# Patient Record
Sex: Female | Born: 1990 | Race: Black or African American | Hispanic: No | Marital: Single | Smoking: Never smoker
Health system: Southern US, Community
[De-identification: ages and names within clinical notes are randomized; demographics above are authoritative.]

## PROBLEM LIST (undated history)

## (undated) DIAGNOSIS — R519 Headache, unspecified: Secondary | ICD-10-CM

## (undated) HISTORY — DX: Headache, unspecified: R51.9

---

## 2011-11-30 ENCOUNTER — Encounter (HOSPITAL_COMMUNITY): Payer: Self-pay

## 2011-11-30 ENCOUNTER — Emergency Department (HOSPITAL_COMMUNITY)
Admission: EM | Admit: 2011-11-30 | Discharge: 2011-11-30 | Disposition: A | Discharge status: Home / Self Care | Payer: Medicaid Other | Source: Home / Self Care

## 2011-11-30 DIAGNOSIS — K089 Disorder of teeth and supporting structures, unspecified: Secondary | ICD-10-CM

## 2011-11-30 DIAGNOSIS — K0889 Other specified disorders of teeth and supporting structures: Secondary | ICD-10-CM

## 2011-11-30 MED ORDER — PENICILLIN V POTASSIUM 500 MG PO TABS: 500.0000 mg | ORAL_TABLET | Freq: Three times a day (TID) | ORAL | Status: AC

## 2011-11-30 MED ORDER — TRAMADOL HCL 50 MG PO TABS: 50.0000 mg | ORAL_TABLET | Freq: Four times a day (QID) | ORAL | Status: AC | PRN

## 2011-11-30 MED ORDER — IBUPROFEN 800 MG PO TABS: 800.0000 mg | ORAL_TABLET | Freq: Three times a day (TID) | ORAL | Status: AC

## 2011-11-30 NOTE — ED Provider Notes (Signed)
History     [name]: [id number]  Arrival date & time 11/30/11  1006   None     Chief Complaint  Patient presents with  . Dental Pain    (Consider location/radiation/quality/duration/timing/severity/associated sxs/prior treatment) HPI Comments: Patient presents today with complaints of dental pain. She states her left lower molar broke last year while she was pregnant, but was not painful.  Approximately 2 weeks ago more of the tooth broke off and has become more painful for her. She has been taking over-the-counter ibuprofen and she gets some improvement of her discomfort for approximately 2 hours . Pain worsens when she bites down on the tooth. No fever or chills. She does have an appointment for routine check up with her dentist in a little over a week. She plans to call them tomorrow (Monday) regarding the tooth pain.   History reviewed. No pertinent past medical history.  Past Surgical History  Procedure Date  . Cesarean section     History reviewed. No pertinent family history.  History  Substance Use Topics  . Smoking status: Never Smoker   . Smokeless tobacco: Not on file  . Alcohol Use: No    OB History    Grav Para Term Preterm Abortions TAB SAB Ect Mult Living                  Review of Systems  Constitutional: Negative for fever and chills.  HENT: Negative for ear pain, congestion, sore throat, rhinorrhea and sinus pressure.   Respiratory: Negative for cough.   Cardiovascular: Negative for chest pain.    Allergies  Review of patient's allergies indicates no known allergies.  Home Medications   Current Outpatient Rx  Name Route Sig Dispense Refill  . IBUPROFEN 800 MG PO TABS Oral Take 1 tablet (800 mg total) by mouth 3 (three) times daily. 15 tablet 0  . PENICILLIN V POTASSIUM 500 MG PO TABS Oral Take 1 tablet (500 mg total) by mouth 3 (three) times daily. 30 tablet 0  . TRAMADOL HCL 50 MG PO TABS Oral Take 1 tablet (50 mg total) by mouth every 6 (six)  hours as needed for pain. 12 tablet 0    BP 136/84  Pulse 80  Temp(Src) 99.1 F (37.3 C) (Oral)  Resp 20  SpO2 100%  LMP 10/25/2011  Physical Exam  Constitutional: She appears well-developed and well-nourished. No distress.  HENT:  Head: Normocephalic and atraumatic.  Right Ear: Tympanic membrane, external ear and ear canal normal.  Left Ear: Tympanic membrane, external ear and ear canal normal.  Nose: Nose normal.  Mouth/Throat: Uvula is midline, oropharynx is clear and moist and mucous membranes are normal. Abnormal dentition. No oropharyngeal exudate, posterior oropharyngeal edema or posterior oropharyngeal erythema.    Neck: Neck supple.  Cardiovascular: Normal rate, regular rhythm and normal heart sounds.   Pulmonary/Chest: Effort normal and breath sounds normal. No respiratory distress.  Lymphadenopathy:    She has no cervical adenopathy.  Neurological: She is alert.  Skin: Skin is warm and dry.  Psychiatric: She has a normal mood and affect.    ED Course  Procedures (including critical care time)  Labs Reviewed - No data to display No results found.   1. Pain, dental       MDM  Broken tooth with possible early abscess formation. Advised to f/u with dentist.         Melody Comas, PA 11/30/11 1116

## 2011-11-30 NOTE — ED Notes (Signed)
Pt has chipped lt sided lower molar that has been hurting since last pm.  Pt has a dentist and tooth has been painful in the past.

## 2011-12-01 NOTE — ED Provider Notes (Signed)
Medical screening examination/treatment/procedure(s) were performed by non-physician practitioner and as supervising physician I was immediately available for consultation/collaboration.   Barkley Bruns MD.    Barkley Bruns, MD 12/01/11 [phone]

## 2012-01-05 ENCOUNTER — Encounter (HOSPITAL_COMMUNITY): Payer: Self-pay

## 2012-01-05 ENCOUNTER — Emergency Department (INDEPENDENT_AMBULATORY_CARE_PROVIDER_SITE_OTHER)
Admission: EM | Admit: 2012-01-05 | Discharge: 2012-01-05 | Disposition: A | Discharge status: Home / Self Care | Payer: Medicaid Other | Source: Home / Self Care | Attending: Emergency Medicine | Admitting: Emergency Medicine

## 2012-01-05 DIAGNOSIS — B373 Candidiasis of vulva and vagina: Secondary | ICD-10-CM

## 2012-01-05 LAB — POCT URINALYSIS DIP (DEVICE)
Glucose, UA: NEGATIVE mg/dL
Hgb urine dipstick: NEGATIVE
Specific Gravity, Urine: 1.015 (ref 1.005–1.030)
Urobilinogen, UA: 0.2 mg/dL (ref 0.0–1.0)

## 2012-01-05 LAB — WET PREP, GENITAL
Clue Cells Wet Prep HPF POC: NONE SEEN
Trich, Wet Prep: NONE SEEN

## 2012-01-05 MED ORDER — FLUCONAZOLE 150 MG PO TABS: 150.0000 mg | ORAL_TABLET | Freq: Once | ORAL | Status: AC

## 2012-01-05 NOTE — ED Provider Notes (Signed)
Chief Complaint  Patient presents with  . Vaginal Itching    History of Present Illness:   Alison Ellison is a 21 year old female who describes a 3 day history of vulvar itching, some brown spotting, but no discharge or odor. She denies any pelvic or lower back pain. No fever, chills, nausea, or vomiting. Her menses have been regular. Last menstrual period was February 25. She is sexually active and uses condoms for birth control.  Review of Systems:  Other than noted above, the patient denies any of the following symptoms: Systemic:  No fever, chills, sweats, fatigue, or weight loss. GI:  No abdominal pain, nausea, anorexia, vomiting, diarrhea, constipation, melena or hematochezia. GU:  No dysuria, frequency, urgency, hematuria, vaginal discharge, itching, or abnormal vaginal bleeding. Skin:  No rash or itching.   PMFSH:  Past medical history, family history, social history, meds, and allergies were reviewed.  Physical Exam:   Vital signs:  BP 112/70  Pulse 94  Temp(Src) 98.4 F (36.9 C) (Oral)  Resp 14  SpO2 100%  LMP 12/15/2011 General:  Alert, oriented and in no distress. Lungs:  Breath sounds clear and equal bilaterally.  No wheezes, rales or rhonchi. Heart:  Regular rhythm.  No gallops or murmers. Abdomen:  Soft, flat and non-distended.  No organomegaly or mass.  No tenderness, guarding or rebound.  Bowel sounds normally active. Pelvic exam:  External genitalia are unremarkable except for a small amount of whitish discharge on the vulva. Vaginal mucosa was normal. She has a scant amount of white discharge which was non-malodorous. Cervix was normal. No cervical motion tenderness, uterus is nontender, no adnexal masses or tenderness. Skin:  Clear, warm and dry.  Labs:   Results for orders placed during the hospital encounter of 01/05/12  POCT URINALYSIS DIP (DEVICE)      Component Value Range   Glucose, UA NEGATIVE  NEGATIVE (mg/dL)   Bilirubin Urine NEGATIVE  NEGATIVE    Ketones,  ur NEGATIVE  NEGATIVE (mg/dL)   Specific Gravity, Urine 1.015  1.005 - 1.030    Hgb urine dipstick NEGATIVE  NEGATIVE    pH 6.0  5.0 - 8.0    Protein, ur 30 (*) NEGATIVE (mg/dL)   Urobilinogen, UA 0.2  0.0 - 1.0 (mg/dL)   Nitrite NEGATIVE  NEGATIVE    Leukocytes, UA SMALL (*) NEGATIVE   POCT PREGNANCY, URINE      Component Value Range   Preg Test, Ur NEGATIVE  NEGATIVE      Assessment:   Diagnoses that have been ruled out:  None  Diagnoses that are still under consideration:  None  Final diagnoses:  Candida vaginitis    Plan:   1.  The following meds were prescribed:   New Prescriptions   FLUCONAZOLE (DIFLUCAN) 150 MG TABLET    Take 1 tablet (150 mg total) by mouth once.   2.  The patient was instructed in symptomatic care and handouts were given. 3.  The patient was told to return if becoming worse in any way, if no better in 3 or 4 days, and given some red flag symptoms that would indicate earlier return.    Reuben Likes, MD 01/05/12 [phone]

## 2012-01-05 NOTE — Discharge Instructions (Signed)
Candida Infection, Adult A candida infection (also called yeast, fungus and Monilia infection) is an overgrowth of yeast that can occur anywhere on the body. A yeast infection commonly occurs in warm, moist body areas. Usually, the infection remains localized but can spread to become a systemic infection. A yeast infection may be a sign of a more severe disease such as diabetes, leukemia, or AIDS. A yeast infection can occur in both men and women. In women, Candida vaginitis is a vaginal infection. It is one of the most common causes of vaginitis. Men usually do not have symptoms or know they have an infection until other problems develop. Men may find out they have a yeast infection because their sex partner has a yeast infection. Uncircumcised men are more likely to get a yeast infection than circumcised men. This is because the uncircumcised glans is not exposed to air and does not remain as dry as that of a circumcised glans. Older adults may develop yeast infections around dentures. CAUSES  Women  Antibiotics.   Steroid medication taken for a long time.   Being overweight (obese).   Diabetes.   Poor immune condition.   Certain serious medical conditions.   Immune suppressive medications for organ transplant patients.   Chemotherapy.   Pregnancy.   Menstration.   Stress and fatigue.   Intravenous drug use.   Oral contraceptives.   Wearing tight-fitting clothes in the crotch area.   Catching it from a sex partner who has a yeast infection.   Spermicide.   Intravenous, urinary, or other catheters.  Men  Catching it from a sex partner who has a yeast infection.   Having oral or anal sex with a person who has the infection.   Spermicide.   Diabetes.   Antibiotics.   Poor immune system.   Medications that suppress the immune system.   Intravenous drug use.   Intravenous, urinary, or other catheters.  SYMPTOMS  Women  Thick, white vaginal discharge.    Vaginal itching.   Redness and swelling in and around the vagina.   Irritation of the lips of the vagina and perineum.   Blisters on the vaginal lips and perineum.   Painful sexual intercourse.   Low blood sugar (hypoglycemia).   Painful urination.   Bladder infections.   Intestinal problems such as constipation, indigestion, bad breath, bloating, increase in gas, diarrhea, or loose stools.  Men  Men may develop intestinal problems such as constipation, indigestion, bad breath, bloating, increase in gas, diarrhea, or loose stools.   Dry, cracked skin on the penis with itching or discomfort.   Jock itch.   Dry, flaky skin.   Athlete's foot.   Hypoglycemia.  DIAGNOSIS  Women  A history and an exam are performed.   The discharge may be examined under a microscope.   A culture may be taken of the discharge.  Men  A history and an exam are performed.   Any discharge from the penis or areas of cracked skin will be looked at under the microscope and cultured.   Stool samples may be cultured.  TREATMENT  Women  Vaginal antifungal suppositories and creams.   Medicated creams to decrease irritation and itching on the outside of the vagina.   Warm compresses to the perineal area to decrease swelling and discomfort.   Oral antifungal medications.   Medicated vaginal suppositories or cream for repeated or recurrent infections.   Wash and dry the irritation areas before applying the cream.     Eating yogurt with lactobacillus may help with prevention and treatment.   Sometimes painting the vagina with gentian violet solution may help if creams and suppositories do not work.  Men  Antifungal creams and oral antifungal medications.   Sometimes treatment must continue for 30 days after the symptoms go away to prevent recurrence.  HOME CARE INSTRUCTIONS  Women  Use cotton underwear and avoid tight-fitting clothing.   Avoid colored, scented toilet paper and  deodorant tampons or pads.   Do not douche.   Keep your diabetes under control.   Finish all the prescribed medications.   Keep your skin clean and dry.   Consume milk or yogurt with lactobacillus active culture regularly. If you get frequent yeast infections and think that is what the infection is, there are over-the-counter medications that you can get. If the infection does not show healing in 3 days, talk to your caregiver.   Tell your sex partner you have a yeast infection. Your partner may need treatment also, especially if your infection does not clear up or recurs.  Men  Keep your skin clean and dry.   Keep your diabetes under control.   Finish all prescribed medications.   Tell your sex partner that you have a yeast infection so they can be treated if necessary.  SEEK MEDICAL CARE IF:   Your symptoms do not clear up or worsen in one week after treatment.   You have an oral temperature above 102 F (38.9 C).   You have trouble swallowing or eating for a prolonged time.   You develop blisters on and around your vagina.   You develop vaginal bleeding and it is not your menstrual period.   You develop abdominal pain.   You develop intestinal problems as mentioned above.   You get weak or lightheaded.   You have painful or increased urination.   You have pain during sexual intercourse.  MAKE SURE YOU:   Understand these instructions.   Will watch your condition.   Will get help right away if you are not doing well or get worse.  Document Released: 11/13/2004 Document Revised: 09/25/2011 Document Reviewed: 02/25/2010 Exit[employer] Patient Information 2012 [employer].  Vaginitis Vaginitis is an infection. It causes soreness, swelling, and redness (inflammation) of the vagina. Many of these infections are sexually transmitted diseases (STDs). Having unprotected sex can cause further problems and complications such as:  Chronic pelvic pain.   Infertility.     Unwanted pregnancy.   Abortion.   Tubal pregnancy.   Infection passed on to the newborn.   Cancer.  CAUSES   Monilia. This is a yeast or fungus infection, not an STD.   Bacterial vaginosis. The normal balance of bacteria in the vagina is disrupted and is replaced by an overgrowth of certain bacteria.   Gonorrhea, chlamydia. These are bacterial infections that are STDs.   Vaginal sponges, diaphragms, and intrauterine devices.   Trichomoniasis. This is a STD infection caused by a parasite.   Viruses like herpes and human papillomavirus. Both are STDs.   Pregnancy.   Immunosuppression. This occurs with certain conditions such as HIV infection or cancer.   Using bubble bath.   Taking certain antibiotic medicines.   Sporadic recurrence can occur if you become sick.   Diabetes.   Steroids.   Allergic reaction. If you have an allergy to:   Douches.   Soaps.   Spermicides.   Condoms.   Scented tampons or vaginal sprays.  SYMPTOMS     Abnormal vaginal discharge.   Itching of the vagina.   Pain in the vagina.   Swelling of the vagina.  In some cases, there are no symptoms. TREATMENT  Treatment will vary depending on the type of infection.  Bacteria or trichomonas are usually treated with oral antibiotics and sometimes vaginal cream or suppositories.   Monilia vaginitis is usually treated with vaginal creams, suppositories, or oral antifungal pills.   Viral vaginitis has no cure. However, the symptoms of herpes (a viral vaginitis) can be treated to relieve the discomfort. Human papillomavirus has no symptoms. However, there are treatments for the diseases caused by human papillomavirus.   With allergic vaginitis, you need to stop using the product that is causing the problem. Vaginal creams can be used to treat the symptoms.   When treating an STD, the sex partner should also be treated.  HOME CARE INSTRUCTIONS   Take all the medicines as directed by  your caregiver.   Do not use scented tampons, soaps, or vaginal sprays.   Do not douche.   Tell your sex partner if you have a vaginal infection or an STD.   Do not have sexual intercourse until you have treated the vaginitis.   Practice safe sex by using condoms.  SEEK MEDICAL CARE IF:   You have abdominal pain.   Your symptoms get worse during treatment.  Document Released: 08/03/2007 Document Revised: 09/25/2011 Document Reviewed: 03/29/2009 Exit[employer] Patient Information 2012 [employer]. 

## 2012-01-05 NOTE — ED Notes (Signed)
C/o vaginal itching and brown discharge for 1 week.  Denies odor or urinary sx.

## 2012-01-06 LAB — GC/CHLAMYDIA PROBE AMP, GENITAL: GC Probe Amp, Genital: NEGATIVE

## 2014-09-29 ENCOUNTER — Encounter (HOSPITAL_COMMUNITY): Payer: Self-pay | Admitting: *Deleted

## 2014-09-29 ENCOUNTER — Emergency Department (INDEPENDENT_AMBULATORY_CARE_PROVIDER_SITE_OTHER)
Admission: EM | Admit: 2014-09-29 | Discharge: 2014-09-29 | Disposition: A | Discharge status: Home / Self Care | Payer: Self-pay | Source: Home / Self Care | Attending: Family Medicine | Admitting: Family Medicine

## 2014-09-29 DIAGNOSIS — N390 Urinary tract infection, site not specified: Secondary | ICD-10-CM

## 2014-09-29 LAB — POCT URINALYSIS DIP (DEVICE)
BILIRUBIN URINE: NEGATIVE
Glucose, UA: NEGATIVE mg/dL
KETONES UR: NEGATIVE mg/dL
Nitrite: NEGATIVE
PROTEIN: 100 mg/dL — AB
SPECIFIC GRAVITY, URINE: 1.015 (ref 1.005–1.030)
Urobilinogen, UA: 1 mg/dL (ref 0.0–1.0)
pH: 7.5 (ref 5.0–8.0)

## 2014-09-29 LAB — POCT PREGNANCY, URINE: PREG TEST UR: NEGATIVE

## 2014-09-29 MED ORDER — CEPHALEXIN 500 MG PO CAPS: 500.0000 mg | ORAL_CAPSULE | Freq: Four times a day (QID) | ORAL | Status: DC

## 2014-09-29 NOTE — Discharge Instructions (Signed)
Take all of medicine as directed, drink lots of fluids, see your doctor if further problems. °

## 2014-09-29 NOTE — ED Notes (Signed)
Pt    Has   Symptoms    Of         Low  abd  Pain  After  Urinating         With        Frequent  Small     Amounts         Pain  r     Side

## 2014-09-29 NOTE — ED Provider Notes (Signed)
[name]: [id number]     Arrival date & time 09/29/14  1256 History   First MD Initiated Contact with Patient 09/29/14 1320     Chief Complaint  Patient presents with  . Urinary Tract Infection   (Consider location/radiation/quality/duration/timing/severity/associated sxs/prior Treatment) Patient is a 23 y.o. female presenting with urinary tract infection. The history is provided by the patient.  Urinary Tract Infection This is a new problem. The current episode started more than 2 days ago. The problem has been gradually worsening. Pertinent negatives include no chest pain and no abdominal pain.    History reviewed. No pertinent past medical history. Past Surgical History  Procedure Laterality Date  . Cesarean section     History reviewed. No pertinent family history. History  Substance Use Topics  . Smoking status: Never Smoker   . Smokeless tobacco: Not on file  . Alcohol Use: No   OB History    No data available     Review of Systems  Constitutional: Negative for fever and chills.  HENT: Negative.   Cardiovascular: Negative for chest pain.  Gastrointestinal: Negative.  Negative for abdominal pain.  Genitourinary: Positive for dysuria, urgency and frequency. Negative for flank pain, vaginal bleeding, vaginal discharge, menstrual problem and pelvic pain.    Allergies  Review of patient's allergies indicates no known allergies.  Home Medications   Prior to Admission medications   Medication Sig Start Date End Date Taking? Authorizing Provider  cephALEXin (KEFLEX) 500 MG capsule Take 1 capsule (500 mg total) by mouth 4 (four) times daily. Take all of medicine and drink lots of fluids 09/29/14   Linna HoffJames D Joanathan Affeldt, MD   BP 123/78 mmHg  Pulse 94  Temp(Src) 98.6 F (37 C) (Oral)  Resp 100  SpO2 100%  LMP 09/16/2014 Physical Exam  Constitutional: She is oriented to person, place, and time. She appears well-developed and well-nourished. No distress.  Abdominal: Soft. Bowel  sounds are normal. She exhibits no mass. There is no tenderness.  Neurological: She is alert and oriented to person, place, and time.  Skin: Skin is warm and dry.  Nursing note and vitals reviewed.   ED Course  Procedures (including critical care time) Labs Review Labs Reviewed  POCT URINALYSIS DIP (DEVICE) - Abnormal; Notable for the following:    Hgb urine dipstick MODERATE (*)    Protein, ur 100 (*)    Leukocytes, UA LARGE (*)    All other components within normal limits  POCT PREGNANCY, URINE    Imaging Review No results found.   MDM   1. UTI (lower urinary tract infection)        Linna HoffJames D Jacari Kirsten, MD 09/29/14 [phone]

## 2015-07-18 ENCOUNTER — Emergency Department (INDEPENDENT_AMBULATORY_CARE_PROVIDER_SITE_OTHER)
Admission: EM | Admit: 2015-07-18 | Discharge: 2015-07-18 | Disposition: A | Discharge status: Home / Self Care | Payer: 59 | Source: Home / Self Care | Attending: Emergency Medicine | Admitting: Emergency Medicine

## 2015-07-18 ENCOUNTER — Other Ambulatory Visit (HOSPITAL_COMMUNITY)
Admission: RE | Admit: 2015-07-18 | Discharge: 2015-07-18 | Disposition: A | Discharge status: Home / Self Care | Payer: 59 | Source: Ambulatory Visit | Attending: Emergency Medicine | Admitting: Emergency Medicine

## 2015-07-18 ENCOUNTER — Encounter (HOSPITAL_COMMUNITY): Payer: Self-pay | Admitting: Emergency Medicine

## 2015-07-18 DIAGNOSIS — N760 Acute vaginitis: Secondary | ICD-10-CM | POA: Diagnosis not present

## 2015-07-18 DIAGNOSIS — K2970 Gastritis, unspecified, without bleeding: Secondary | ICD-10-CM | POA: Diagnosis not present

## 2015-07-18 DIAGNOSIS — A499 Bacterial infection, unspecified: Secondary | ICD-10-CM | POA: Diagnosis not present

## 2015-07-18 DIAGNOSIS — B9689 Other specified bacterial agents as the cause of diseases classified elsewhere: Secondary | ICD-10-CM

## 2015-07-18 DIAGNOSIS — Z113 Encounter for screening for infections with a predominantly sexual mode of transmission: Secondary | ICD-10-CM | POA: Diagnosis not present

## 2015-07-18 LAB — POCT URINALYSIS DIP (DEVICE)
BILIRUBIN URINE: NEGATIVE
GLUCOSE, UA: NEGATIVE mg/dL
KETONES UR: NEGATIVE mg/dL
Nitrite: NEGATIVE
PROTEIN: NEGATIVE mg/dL
Specific Gravity, Urine: 1.015 (ref 1.005–1.030)
Urobilinogen, UA: 0.2 mg/dL (ref 0.0–1.0)
pH: 6 (ref 5.0–8.0)

## 2015-07-18 LAB — POCT PREGNANCY, URINE: PREG TEST UR: NEGATIVE

## 2015-07-18 MED ORDER — OMEPRAZOLE 20 MG PO CPDR: 20.0000 mg | DELAYED_RELEASE_CAPSULE | Freq: Every day | ORAL | Status: DC

## 2015-07-18 MED ORDER — METRONIDAZOLE 500 MG PO TABS: 500.0000 mg | ORAL_TABLET | Freq: Two times a day (BID) | ORAL | Status: DC

## 2015-07-18 NOTE — ED Provider Notes (Signed)
[name]: [id number]     Arrival date & time 07/18/15  1423 History   First MD Initiated Contact with Patient 07/18/15 1518     Chief Complaint  Patient presents with  . Abdominal Pain   (Consider location/radiation/quality/duration/timing/severity/associated sxs/prior Treatment) HPI  She is a 24 year old woman here for evaluation of abdominal pain and vaginal discharge. She states the abdominal pain started about 2 weeks ago. It is epigastrically located. She states it feels sort of crampy. It comes and goes. Eating seems to make it feel better. Having a bowel movement also seems to improve it. She reports some nausea, but only pain gets bad. No vomiting. No fevers or chills. She has been drinking a lot of Pepsi in the last 2 weeks.  She also reports vaginal discharge over the last 2 weeks. She states it isn't clear to yellowish. She also reports irritation and itching in the vagina. She denies any dysuria or urinary frequency. No new sexual partners. She is sexually active with a known partner. They do not use condoms. She has a Mirena that was placed in May of this year.  History reviewed. No pertinent past medical history. Past Surgical History  Procedure Laterality Date  . Cesarean section     No family history on file. Social History  Substance Use Topics  . Smoking status: Never Smoker   . Smokeless tobacco: None  . Alcohol Use: No   OB History    No data available     Review of Systems As in history of present illness Allergies  Review of patient's allergies indicates no known allergies.  Home Medications   Prior to Admission medications   Medication Sig Start Date End Date Taking? Authorizing Provider  metroNIDAZOLE (FLAGYL) 500 MG tablet Take 1 tablet (500 mg total) by mouth 2 (two) times daily. 07/18/15   Charm Rings, MD  omeprazole (PRILOSEC) 20 MG capsule Take 1 capsule (20 mg total) by mouth daily. 07/18/15   Charm Rings, MD   Meds Ordered and Administered this Visit   Medications - No data to display  BP 123/60 mmHg  Pulse 93  Temp(Src) 98.4 F (36.9 C) (Oral)  Resp 16  SpO2 100%  LMP 06/06/2015 No data found.   Physical Exam  Constitutional: She is oriented to person, place, and time. She appears well-developed and well-nourished. No distress.  Neck: Neck supple.  Cardiovascular: Normal rate.   Pulmonary/Chest: Effort normal.  Abdominal: Soft. Bowel sounds are normal. She exhibits no distension. There is tenderness (in epigastric). There is no rebound and no guarding.  No CVA tenderness  Genitourinary: There is no rash on the right labia. There is no rash on the left labia. Cervix exhibits no motion tenderness and no discharge. No bleeding in the vagina. No foreign body around the vagina. No signs of injury around the vagina. Vaginal discharge (yellowish and thin) found.  IUD strings visualized  Neurological: She is alert and oriented to person, place, and time.    ED Course  Procedures (including critical care time)  Labs Review Labs Reviewed  POCT URINALYSIS DIP (DEVICE) - Abnormal; Notable for the following:    Hgb urine dipstick TRACE (*)    Leukocytes, UA SMALL (*)    All other components within normal limits  CERVICOVAGINAL ANCILLARY ONLY    Imaging Review No results found.    MDM   1. BV (bacterial vaginosis)   2. Gastritis    Treat BV with Flagyl. We'll do a trial  of omeprazole for her epigastric pain. Swab sent for testing. Follow-up as needed.    Charm Rings, MD 07/18/15 [phone]

## 2015-07-18 NOTE — Discharge Instructions (Signed)
You have BV. This is an overgrowth of normal vaginal bacteria. Take Flagyl twice a day for 7 days. Do not drink alcohol while taking this medicine. We have sent swabs for testing. We will call you if anything is positive.  I think your stomach pain is coming from some gastritis or heartburn. Take omeprazole daily for the next 2 weeks, then as needed.  Please come back if your symptoms are worsening.

## 2015-07-18 NOTE — ED Notes (Signed)
2 week history of abdominal pain, vaginal discharge and irritation

## 2015-07-19 LAB — CERVICOVAGINAL ANCILLARY ONLY
CHLAMYDIA, DNA PROBE: NEGATIVE
NEISSERIA GONORRHEA: NEGATIVE
TRICH (WINDOWPATH): NEGATIVE

## 2015-07-19 NOTE — ED Notes (Signed)
Final report of STD testing negative. Still waiting on wet prep report

## 2015-07-20 LAB — CERVICOVAGINAL ANCILLARY ONLY: WET PREP (BD AFFIRM): POSITIVE — AB

## 2015-07-20 NOTE — ED Notes (Signed)
Final report vaginal testing swabs. Negative for STD, positive for gardnerella only. treatment adequate

## 2015-07-31 NOTE — ED Notes (Signed)
Patient called, inquiring about lab reports.after verifying ID discussed labs.  Positive for yeast and gardnerella. Called in Rx for Diflucan 150 mg, 1 now and 1 in 1 week if continues to have symptoms to [facility], [address] at patient request. Left detailed message on store answering machine.

## 2015-09-21 ENCOUNTER — Emergency Department (INDEPENDENT_AMBULATORY_CARE_PROVIDER_SITE_OTHER)
Admission: EM | Admit: 2015-09-21 | Discharge: 2015-09-21 | Disposition: A | Discharge status: Home / Self Care | Payer: 59 | Source: Home / Self Care | Attending: Family Medicine | Admitting: Family Medicine

## 2015-09-21 ENCOUNTER — Encounter (HOSPITAL_COMMUNITY): Payer: Self-pay | Admitting: *Deleted

## 2015-09-21 DIAGNOSIS — K529 Noninfective gastroenteritis and colitis, unspecified: Secondary | ICD-10-CM | POA: Diagnosis not present

## 2015-09-21 DIAGNOSIS — R197 Diarrhea, unspecified: Secondary | ICD-10-CM

## 2015-09-21 MED ORDER — DIPHENOXYLATE-ATROPINE 2.5-0.025 MG PO TABS: 1.0000 | ORAL_TABLET | Freq: Four times a day (QID) | ORAL | Status: DC | PRN

## 2015-09-21 NOTE — ED Provider Notes (Addendum)
[name]: [id number]     Arrival date & time 09/21/15  1303 History   First MD Initiated Contact with Patient 09/21/15 1330     Chief Complaint  Patient presents with  . Diarrhea   (Consider location/radiation/quality/duration/timing/severity/associated sxs/prior Treatment) Patient is a 24 y.o. female presenting with diarrhea.  Diarrhea Quality:  Watery Severity:  Mild Onset quality:  Gradual Progression:  Unchanged Relieved by:  None tried Worsened by:  Nothing tried Ineffective treatments:  None tried Associated symptoms: no abdominal pain, no fever and no vomiting   Risk factors: no sick contacts and no suspicious food intake     History reviewed. No pertinent past medical history. Past Surgical History  Procedure Laterality Date  . Cesarean section     History reviewed. No pertinent family history. Social History  Substance Use Topics  . Smoking status: Never Smoker   . Smokeless tobacco: None  . Alcohol Use: No   OB History    No data available     Review of Systems  Constitutional: Negative for fever.  HENT: Negative.   Respiratory: Negative.   Gastrointestinal: Positive for diarrhea. Negative for nausea, vomiting, abdominal pain, constipation, blood in stool and anal bleeding.  Genitourinary: Negative.  Negative for dysuria and urgency.  Neurological: Positive for weakness.  All other systems reviewed and are negative.   Allergies  Review of patient's allergies indicates no known allergies.  Home Medications   Prior to Admission medications   Medication Sig Start Date End Date Taking? Authorizing Provider  diphenoxylate-atropine (LOMOTIL) 2.5-0.025 MG tablet Take 1 tablet by mouth 4 (four) times daily as needed for diarrhea or loose stools. 09/21/15   Linna HoffJames D Kindl, MD  metroNIDAZOLE (FLAGYL) 500 MG tablet Take 1 tablet (500 mg total) by mouth 2 (two) times daily. 07/18/15   Charm RingsErin J Honig, MD  omeprazole (PRILOSEC) 20 MG capsule Take 1 capsule (20 mg total) by  mouth daily. 07/18/15   Charm RingsErin J Honig, MD   Meds Ordered and Administered this Visit  Medications - No data to display  BP 108/72 mmHg  Pulse 104  Temp(Src) 99.6 F (37.6 C) (Oral)  Resp 16  SpO2 100% No data found.   Physical Exam  Constitutional: She is oriented to person, place, and time. She appears well-developed and well-nourished. No distress.  HENT:  Mouth/Throat: Oropharynx is clear and moist.  Neck: Normal range of motion. Neck supple.  Cardiovascular: Normal heart sounds.   Pulmonary/Chest: Breath sounds normal.  Abdominal: Soft. Bowel sounds are normal. She exhibits no distension and no mass. There is no tenderness. There is no rebound and no guarding.  Musculoskeletal: She exhibits no edema.  Lymphadenopathy:    She has no cervical adenopathy.  Neurological: She is alert and oriented to person, place, and time.  Skin: Skin is warm and dry.  Nursing note and vitals reviewed.   ED Course  Procedures (including critical care time)  Labs Review Labs Reviewed - No data to display  Imaging Review No results found.   Visual Acuity Review  Right Eye Distance:   Left Eye Distance:   Bilateral Distance:    Right Eye Near:   Left Eye Near:    Bilateral Near:         MDM   1. Diarrhea in adult patient        Linna HoffJames D Kindl, MD 09/21/15 1429  Linna HoffJames D Kindl, MD 10/10/15 2102

## 2015-09-21 NOTE — ED Notes (Signed)
Body  Aches   Chills      Weak        Nausea         X  sev  Weeks          denys   Any  Urinary  Symptoms

## 2015-09-21 NOTE — Discharge Instructions (Signed)
Clear liquid , bland diet tonight as tolerated, advance on sat as improved, use medicine as needed, return or see your doctor if any problems. °

## 2016-03-03 ENCOUNTER — Encounter (HOSPITAL_COMMUNITY): Payer: Self-pay | Admitting: Emergency Medicine

## 2016-03-03 ENCOUNTER — Emergency Department (HOSPITAL_COMMUNITY)
Admission: EM | Admit: 2016-03-03 | Discharge: 2016-03-03 | Disposition: A | Discharge status: Home / Self Care | Payer: 59 | Attending: Emergency Medicine | Admitting: Emergency Medicine

## 2016-03-03 DIAGNOSIS — R51 Headache: Secondary | ICD-10-CM | POA: Insufficient documentation

## 2016-03-03 DIAGNOSIS — S199XXA Unspecified injury of neck, initial encounter: Secondary | ICD-10-CM | POA: Diagnosis present

## 2016-03-03 DIAGNOSIS — Y939 Activity, unspecified: Secondary | ICD-10-CM | POA: Diagnosis not present

## 2016-03-03 DIAGNOSIS — Y999 Unspecified external cause status: Secondary | ICD-10-CM | POA: Diagnosis not present

## 2016-03-03 DIAGNOSIS — Z79899 Other long term (current) drug therapy: Secondary | ICD-10-CM | POA: Diagnosis not present

## 2016-03-03 DIAGNOSIS — S60811A Abrasion of right wrist, initial encounter: Secondary | ICD-10-CM | POA: Insufficient documentation

## 2016-03-03 DIAGNOSIS — Z792 Long term (current) use of antibiotics: Secondary | ICD-10-CM | POA: Diagnosis not present

## 2016-03-03 DIAGNOSIS — T23171A Burn of first degree of right wrist, initial encounter: Secondary | ICD-10-CM | POA: Diagnosis not present

## 2016-03-03 DIAGNOSIS — Z23 Encounter for immunization: Secondary | ICD-10-CM | POA: Diagnosis not present

## 2016-03-03 DIAGNOSIS — Y92410 Unspecified street and highway as the place of occurrence of the external cause: Secondary | ICD-10-CM | POA: Diagnosis not present

## 2016-03-03 DIAGNOSIS — S134XXA Sprain of ligaments of cervical spine, initial encounter: Secondary | ICD-10-CM | POA: Insufficient documentation

## 2016-03-03 DIAGNOSIS — M542 Cervicalgia: Secondary | ICD-10-CM

## 2016-03-03 DIAGNOSIS — R519 Headache, unspecified: Secondary | ICD-10-CM

## 2016-03-03 MED ORDER — NAPROXEN 500 MG PO TABS
500.0000 mg | ORAL_TABLET | Freq: Once | ORAL | Status: AC
  Administered 2016-03-03: 500 mg via ORAL
  Filled 2016-03-03: qty 1

## 2016-03-03 MED ORDER — HYDROCODONE-ACETAMINOPHEN 5-325 MG PO TABS: 1.0000 | ORAL_TABLET | Freq: Four times a day (QID) | ORAL | Status: DC | PRN

## 2016-03-03 MED ORDER — NAPROXEN 500 MG PO TABS: 500.0000 mg | ORAL_TABLET | Freq: Two times a day (BID) | ORAL | Status: DC | PRN

## 2016-03-03 MED ORDER — HYDROCODONE-ACETAMINOPHEN 5-325 MG PO TABS
1.0000 | ORAL_TABLET | Freq: Once | ORAL | Status: AC
  Administered 2016-03-03: 1 via ORAL
  Filled 2016-03-03: qty 1

## 2016-03-03 MED ORDER — TETANUS-DIPHTH-ACELL PERTUSSIS 5-2.5-18.5 LF-MCG/0.5 IM SUSP
0.5000 mL | Freq: Once | INTRAMUSCULAR | Status: AC
  Administered 2016-03-03: 0.5 mL via INTRAMUSCULAR
  Filled 2016-03-03: qty 0.5

## 2016-03-03 MED ORDER — CYCLOBENZAPRINE HCL 10 MG PO TABS
5.0000 mg | ORAL_TABLET | Freq: Once | ORAL | Status: AC
  Administered 2016-03-03: 5 mg via ORAL
  Filled 2016-03-03: qty 1

## 2016-03-03 MED ORDER — CYCLOBENZAPRINE HCL 10 MG PO TABS: 10.0000 mg | ORAL_TABLET | Freq: Three times a day (TID) | ORAL | Status: DC | PRN
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
## 2017-02-25 ENCOUNTER — Ambulatory Visit (HOSPITAL_COMMUNITY)
Admission: EM | Admit: 2017-02-25 | Discharge: 2017-02-25 | Disposition: A | Discharge status: Home / Self Care | Payer: 59 | Attending: Internal Medicine | Admitting: Internal Medicine

## 2017-02-25 ENCOUNTER — Encounter (HOSPITAL_COMMUNITY): Payer: Self-pay | Admitting: Emergency Medicine

## 2017-02-25 DIAGNOSIS — J029 Acute pharyngitis, unspecified: Secondary | ICD-10-CM | POA: Diagnosis not present

## 2017-02-25 DIAGNOSIS — M791 Myalgia: Secondary | ICD-10-CM

## 2017-02-25 LAB — POCT RAPID STREP A: Streptococcus, Group A Screen (Direct): NEGATIVE

## 2017-02-25 NOTE — ED Triage Notes (Signed)
Symptoms started last night.  Stuffy in head, headache, sore throat.  One episode of diarrhea today

## 2017-02-25 NOTE — Discharge Instructions (Addendum)
Strep swab was negative today at the urgent care.  A throat culture is pending; the urgent care will contact you if additional treatment is needed.  Rest and push fluids.  Note for work given.  Ibuprofen or tylenol for pain.  Recheck for new fever >100.5, increasing phlegm production/nasal discharge, or if not starting to improve in a few days.

## 2017-02-25 NOTE — ED Provider Notes (Signed)
[facility]    [name]: [id number] Arrival date & time: 02/25/17  1541     History   Chief Complaint Chief Complaint  Patient presents with  . URI    HPI Alison Ellison is a 26 y.o. female. She had the onset of sore throat and body aches yesterday evening. No fever, no runny/congested nose. Not coughing. No nausea/vomiting. Little bit loose stool today. Her right ear hurt a few days ago, but has resolved now.    HPI  History reviewed. No pertinent past medical history.   Past Surgical History:  Procedure Laterality Date  . CESAREAN SECTION       Home Medications   Takes no meds regularly  Family History No family history on file.  Social History Social History  Substance Use Topics  . Smoking status: Never Smoker  . Smokeless tobacco: Not on file  . Alcohol use No     Allergies   Aspirin   Review of Systems Review of Systems  All other systems reviewed and are negative.    Physical Exam Triage Vital Signs ED Triage Vitals  Enc Vitals Group     BP 02/25/17 1632 119/73     Pulse Rate 02/25/17 1632 94     Resp 02/25/17 1632 18     Temp 02/25/17 1632 98.3 F (36.8 C)     Temp Source 02/25/17 1632 Oral     SpO2 02/25/17 1632 99 %     Weight --      Height --      Pain Score 02/25/17 1629 7     Pain Loc --    Updated Vital Signs BP 119/73 (BP Location: Right Arm)   Pulse 94   Temp 98.3 F (36.8 C) (Oral)   Resp 18   SpO2 99%   Physical Exam  Constitutional: She is oriented to person, place, and time. No distress.  HENT:  Head: Atraumatic.  Bilateral TMs are moderately dull Moderate nasal congestion bilaterally Throat is injected with deep pink, large tonsils, no exudates  Eyes:  Conjugate gaze observed, no eye redness/discharge  Neck: Neck supple.  Cardiovascular: Normal rate and regular rhythm.   Pulmonary/Chest: No respiratory distress. She has no wheezes. She has no rales.  Abdominal: She exhibits no distension.    Musculoskeletal: Normal range of motion.  Neurological: She is alert and oriented to person, place, and time.  Skin: Skin is warm and dry.  Nursing note and vitals reviewed.    UC Treatments / Results  Labs Results for orders placed or performed during the hospital encounter of 02/25/17  Culture, group A strep  Result Value Ref Range   Specimen Description THROAT    Special Requests NONE    Culture NO GROUP A STREP (S.PYOGENES) ISOLATED    Report Status 02/28/2017 FINAL   POCT rapid strep A [facility]  Result Value Ref Range   Streptococcus, Group A Screen (Direct) NEGATIVE NEGATIVE     Procedures Procedures (including critical care time) None today  Final Clinical Impressions(s) / UC Diagnoses   Final diagnoses:  Acute pharyngitis, unspecified etiology   Strep swab was negative today at the urgent care.  A throat culture is pending; the urgent care will contact you if additional treatment is needed.  Rest and push fluids.  Note for work given.  Ibuprofen or tylenol for pain.  Recheck for new fever >100.5, increasing phlegm production/nasal discharge, or if not starting to improve in a few days.  Eustace Moore, MD 03/01/17 [phone]

## 2017-02-28 LAB — CULTURE, GROUP A STREP (THRC)

## 2017-03-05 ENCOUNTER — Encounter (HOSPITAL_COMMUNITY): Payer: Self-pay | Admitting: Emergency Medicine

## 2017-03-05 ENCOUNTER — Ambulatory Visit (HOSPITAL_COMMUNITY)
Admission: EM | Admit: 2017-03-05 | Discharge: 2017-03-05 | Disposition: A | Discharge status: Home / Self Care | Payer: 59 | Attending: Internal Medicine | Admitting: Internal Medicine

## 2017-03-05 DIAGNOSIS — H6981 Other specified disorders of Eustachian tube, right ear: Secondary | ICD-10-CM

## 2017-03-05 DIAGNOSIS — H9203 Otalgia, bilateral: Secondary | ICD-10-CM | POA: Diagnosis not present

## 2017-03-05 DIAGNOSIS — H66002 Acute suppurative otitis media without spontaneous rupture of ear drum, left ear: Secondary | ICD-10-CM

## 2017-03-05 MED ORDER — AMOXICILLIN 500 MG PO CAPS: 1000.0000 mg | ORAL_CAPSULE | Freq: Two times a day (BID) | ORAL | 0 refills | Status: DC

## 2017-03-05 NOTE — ED Triage Notes (Signed)
Onset 2 days ago with ear pain.  Seen last week for sore throat.  Patient has been coughing up mucus for a couple of day

## 2017-03-05 NOTE — ED Provider Notes (Signed)
[name]: [id number]     Arrival date & time 03/05/17  1704 History   First MD Initiated Contact with Patient 03/05/17 1821     Chief Complaint  Patient presents with  . Otalgia   (Consider location/radiation/quality/duration/timing/severity/associated sxs/prior Treatment) Bilateral earaches for the past 2 days. It is greater than right. Continues to have PND and nasal congestion. She has taken ibuprofen and has put some sort of OTC ear drops in her left ear for pain.      History reviewed. No pertinent past medical history. Past Surgical History:  Procedure Laterality Date  . CESAREAN SECTION     History reviewed. No pertinent family history. Social History  Substance Use Topics  . Smoking status: Never Smoker  . Smokeless tobacco: Not on file  . Alcohol use No   OB History    No data available     Review of Systems  Constitutional: Negative.  Negative for activity change, appetite change, chills, fatigue and fever.  HENT: Positive for congestion, ear pain, postnasal drip and rhinorrhea. Negative for facial swelling.   Eyes: Negative.   Respiratory: Negative.   Cardiovascular: Negative.   Musculoskeletal: Negative for neck pain and neck stiffness.  Skin: Negative for pallor and rash.  Neurological: Negative.   All other systems reviewed and are negative.   Allergies  Aspirin  Home Medications   Prior to Admission medications   Medication Sig Start Date End Date Taking? Authorizing Provider  GuaiFENesin (MUCINEX PO) Take by mouth.   Yes [provider]  ibuprofen (ADVIL,MOTRIN) 800 MG tablet Take 800 mg by mouth every 8 (eight) hours as needed.   Yes [provider]  amoxicillin (AMOXIL) 500 MG capsule Take 2 capsules (1,000 mg total) by mouth 2 (two) times daily. 03/05/17   Hayden Rasmussen, NP   Meds Ordered and Administered this Visit  Medications - No data to display  BP 113/74 (BP Location: Right Arm)   Pulse 86   Temp 99.3 F (37.4 C) (Oral)    Resp 14   SpO2 100%  No data found.   Physical Exam  Constitutional: She is oriented to person, place, and time. She appears well-developed and well-nourished.  HENT:  Head: Normocephalic and atraumatic.  Right TM early gray translucent. Retracted. Left TM with partial retraction, erythema to the anterior and superior aspect. Appears to have a yellowish to amber fluid to the 6 and 7:00 position. Oropharynx with moderate amount of clear PND, cobblestoning and light erythema.  Eyes: EOM are normal.  Neck: Normal range of motion. Neck supple.  Cardiovascular: Normal rate.   Pulmonary/Chest: Effort normal and breath sounds normal.  Lymphadenopathy:    She has no cervical adenopathy.  Neurological: She is alert and oriented to person, place, and time.  Skin: Skin is warm and dry.  Psychiatric: She has a normal mood and affect.  Nursing note and vitals reviewed.   Urgent Care Course     Procedures (including critical care time)  Labs Review Labs Reviewed - No data to display  Imaging Review No results found.   Visual Acuity Review  Right Eye Distance:   Left Eye Distance:   Bilateral Distance:    Right Eye Near:   Left Eye Near:    Bilateral Near:         MDM   1. Otalgia of both ears   2. Acute suppurative otitis media of left ear without spontaneous rupture of tympanic membrane, recurrence not specified   3. Eustachian  tube dysfunction, right    Take the medication as directed. In addition take Sudafed PE 10 mg every 4 hours as a decongestant and take either Zyrtec or Allegra daily to help with drainage in the back of your throat. Ibuprofen 600 mg every 6 hours as needed for pain. Meds ordered this encounter  Medications  . ibuprofen (ADVIL,MOTRIN) 800 MG tablet    Sig: Take 800 mg by mouth every 8 (eight) hours as needed.  . GuaiFENesin (MUCINEX PO)    Sig: Take by mouth.  Marland Kitchen. amoxicillin (AMOXIL) 500 MG capsule    Sig: Take 2 capsules (1,000 mg total) by  mouth 2 (two) times daily.    Dispense:  40 capsule    Refill:  0    Order Specific Question:   Supervising Provider    Answer:   Eustace MooreMURRAY, LAURA W [[id number]]       Hayden RasmussenMabe, Keyra Virella, NP 03/05/17 [phone]

## 2017-03-05 NOTE — Discharge Instructions (Signed)
Take the medication as directed. In addition take Sudafed PE 10 mg every 4 hours as a decongestant and take either Zyrtec or Allegra daily to help with drainage in the back of your throat. Ibuprofen 600 mg every 6 hours as needed for pain.

## 2017-05-02 ENCOUNTER — Ambulatory Visit (HOSPITAL_COMMUNITY)
Admission: EM | Admit: 2017-05-02 | Discharge: 2017-05-02 | Disposition: A | Discharge status: Home / Self Care | Payer: 59 | Attending: Internal Medicine | Admitting: Internal Medicine

## 2017-05-02 ENCOUNTER — Encounter (HOSPITAL_COMMUNITY): Payer: Self-pay | Admitting: *Deleted

## 2017-05-02 DIAGNOSIS — N3001 Acute cystitis with hematuria: Secondary | ICD-10-CM

## 2017-05-02 DIAGNOSIS — Z3202 Encounter for pregnancy test, result negative: Secondary | ICD-10-CM | POA: Insufficient documentation

## 2017-05-02 DIAGNOSIS — Z113 Encounter for screening for infections with a predominantly sexual mode of transmission: Secondary | ICD-10-CM

## 2017-05-02 DIAGNOSIS — R300 Dysuria: Secondary | ICD-10-CM | POA: Diagnosis not present

## 2017-05-02 LAB — POCT URINALYSIS DIP (DEVICE)
BILIRUBIN URINE: NEGATIVE
Glucose, UA: NEGATIVE mg/dL
Ketones, ur: NEGATIVE mg/dL
NITRITE: NEGATIVE
PH: 7 (ref 5.0–8.0)
Protein, ur: 30 mg/dL — AB
Specific Gravity, Urine: 1.015 (ref 1.005–1.030)
UROBILINOGEN UA: 1 mg/dL (ref 0.0–1.0)

## 2017-05-02 LAB — POCT PREGNANCY, URINE: PREG TEST UR: NEGATIVE

## 2017-05-02 MED ORDER — NITROFURANTOIN MONOHYD MACRO 100 MG PO CAPS: 100.0000 mg | ORAL_CAPSULE | Freq: Two times a day (BID) | ORAL | 0 refills | Status: AC

## 2017-05-02 NOTE — ED Triage Notes (Signed)
C/O dysuria without polyuria since yesterday.  Reports blood on toilet tissue when she wipes.  Denies flank or back pain.  Denies fevers.

## 2017-05-02 NOTE — ED Triage Notes (Signed)
Pt also requesting getting tested for STDs.  Denies any vaginal discharge.

## 2017-05-02 NOTE — ED Provider Notes (Signed)
[name]: [id number]     Arrival date & time 05/02/17  1237 History   First MD Initiated Contact with Patient 05/02/17 1440     Chief Complaint  Patient presents with  . Dysuria   (Consider location/radiation/quality/duration/timing/severity/associated sxs/prior Treatment) Patient is a healthy 26 year old female, presents today for possible UTI. Patient reports dysuria and hematuria 2 days. She otherwise denies nausea, fever, abdominal pain, back pain.  Patient also wants STD testing. She is asymptomatic with no vaginal discharge or pelvic pain. Patient states that she just wanted to be check to be safe. She is sexually active with 1 partner.        History reviewed. No pertinent past medical history. Past Surgical History:  Procedure Laterality Date  . CESAREAN SECTION     No family history on file. Social History  Substance Use Topics  . Smoking status: Never Smoker  . Smokeless tobacco: Not on file  . Alcohol use No   OB History    No data available     Review of Systems  Constitutional:       See history of present illness    Allergies  Aspirin  Home Medications   Prior to Admission medications   Medication Sig Start Date End Date Taking? Authorizing Provider  amoxicillin (AMOXIL) 500 MG capsule Take 2 capsules (1,000 mg total) by mouth 2 (two) times daily. 03/05/17   Hayden RasmussenMabe, David, NP  GuaiFENesin (MUCINEX PO) Take by mouth.    [provider]  ibuprofen (ADVIL,MOTRIN) 800 MG tablet Take 800 mg by mouth every 8 (eight) hours as needed.    [provider]  nitrofurantoin, macrocrystal-monohydrate, (MACROBID) 100 MG capsule Take 1 capsule (100 mg total) by mouth 2 (two) times daily. 05/02/17 05/07/17  Lucia EstelleZheng, Baldemar Dady, NP   Meds Ordered and Administered this Visit  Medications - No data to display  BP 124/83   Pulse 73   Temp 98.2 F (36.8 C) (Temporal)   Resp 16   SpO2 100%  No data found.   Physical Exam  Constitutional: She is oriented to  person, place, and time. She appears well-developed and well-nourished.  Cardiovascular: Normal rate, regular rhythm and normal heart sounds.   No murmur heard. Pulmonary/Chest: Effort normal and breath sounds normal. She has no wheezes.  Abdominal: Soft. Bowel sounds are normal.  Some suprapubic discomfort noted on palpation  Genitourinary:  Genitourinary Comments: Negative CVA tenderness  Neurological: She is alert and oriented to person, place, and time.  Skin: Skin is warm and dry.  Psychiatric: She has a normal mood and affect.  Nursing note and vitals reviewed.   Urgent Care Course     Procedures (including critical care time)  Labs Review Labs Reviewed  POCT URINALYSIS DIP (DEVICE) - Abnormal; Notable for the following:       Result Value   Hgb urine dipstick MODERATE (*)    Protein, ur 30 (*)    Leukocytes, UA MODERATE (*)    All other components within normal limits  URINE CULTURE  POCT PREGNANCY, URINE  URINE CYTOLOGY ANCILLARY ONLY    Imaging Review No results found.  MDM   1. Acute cystitis with hematuria   2. Screening for STD (sexually transmitted disease)    UA has moderate amt of hgb and moderate amt of leukocytes. We'll treat empirically for UTI with Macrobid twice a day 5 days. Urine culture is pending.   Urine cytology pending for GC/CT and Trich, informed that we will only call her  if result is positive.      Lucia Estelle, NP 05/02/17 [phone]

## 2017-05-02 NOTE — ED Notes (Signed)
Call back number verified and updated in EPIC... Adv pt to not have SI until lab results comeback neg.... Also adv pt lab results will be on MyChart; instructions given .... Pt verb understanding.   

## 2017-05-04 LAB — URINE CULTURE

## 2017-05-04 LAB — URINE CYTOLOGY ANCILLARY ONLY
Chlamydia: NEGATIVE
NEISSERIA GONORRHEA: NEGATIVE
TRICH (WINDOWPATH): NEGATIVE

## 2017-12-17 DIAGNOSIS — Z975 Presence of (intrauterine) contraceptive device: Secondary | ICD-10-CM | POA: Insufficient documentation

## 2017-12-17 DIAGNOSIS — R87610 Atypical squamous cells of undetermined significance on cytologic smear of cervix (ASC-US): Secondary | ICD-10-CM | POA: Insufficient documentation

## 2017-12-17 DIAGNOSIS — R87810 Cervical high risk human papillomavirus (HPV) DNA test positive: Secondary | ICD-10-CM | POA: Insufficient documentation

## 2018-01-17 DIAGNOSIS — N870 Mild cervical dysplasia: Secondary | ICD-10-CM | POA: Insufficient documentation

## 2018-05-29 ENCOUNTER — Emergency Department (HOSPITAL_COMMUNITY)
Admission: EM | Admit: 2018-05-29 | Discharge: 2018-05-29 | Disposition: A | Discharge status: Home / Self Care | Payer: 59 | Attending: Emergency Medicine | Admitting: Emergency Medicine

## 2018-05-29 ENCOUNTER — Encounter (HOSPITAL_COMMUNITY): Payer: Self-pay | Admitting: Emergency Medicine

## 2018-05-29 DIAGNOSIS — R110 Nausea: Secondary | ICD-10-CM

## 2018-05-29 DIAGNOSIS — R51 Headache: Secondary | ICD-10-CM | POA: Diagnosis not present

## 2018-05-29 DIAGNOSIS — R519 Headache, unspecified: Secondary | ICD-10-CM

## 2018-05-29 DIAGNOSIS — Z79899 Other long term (current) drug therapy: Secondary | ICD-10-CM | POA: Diagnosis not present

## 2018-05-29 LAB — CBC WITH DIFFERENTIAL/PLATELET
BASOS PCT: 0 %
Basophils Absolute: 0 10*3/uL (ref 0.0–0.1)
EOS ABS: 0.1 10*3/uL (ref 0.0–0.7)
Eosinophils Relative: 1 %
HEMATOCRIT: 36.1 % (ref 36.0–46.0)
HEMOGLOBIN: 12.7 g/dL (ref 12.0–15.0)
LYMPHS ABS: 2.9 10*3/uL (ref 0.7–4.0)
Lymphocytes Relative: 42 %
MCH: 29.3 pg (ref 26.0–34.0)
MCHC: 35.2 g/dL (ref 30.0–36.0)
MCV: 83.4 fL (ref 78.0–100.0)
MONOS PCT: 5 %
Monocytes Absolute: 0.4 10*3/uL (ref 0.1–1.0)
NEUTROS ABS: 3.5 10*3/uL (ref 1.7–7.7)
NEUTROS PCT: 52 %
Platelets: 246 10*3/uL (ref 150–400)
RBC: 4.33 MIL/uL (ref 3.87–5.11)
RDW: 13 % (ref 11.5–15.5)
WBC: 6.9 10*3/uL (ref 4.0–10.5)

## 2018-05-29 LAB — I-STAT BETA HCG BLOOD, ED (MC, WL, AP ONLY): I-stat hCG, quantitative: <5 m[IU]/mL (ref <5)

## 2018-05-29 LAB — COMPREHENSIVE METABOLIC PANEL
ALBUMIN: 4.1 g/dL (ref 3.5–5.0)
ALK PHOS: 49 U/L (ref 38–126)
ALT: 14 U/L (ref 0–44)
AST: 26 U/L (ref 15–41)
Anion gap: 8 (ref 5–15)
BUN: 13 mg/dL (ref 6–20)
CALCIUM: 8.9 mg/dL (ref 8.9–10.3)
CO2: 27 mmol/L (ref 22–32)
CREATININE: 1.07 mg/dL — AB (ref 0.44–1.00)
Chloride: 106 mmol/L (ref 98–111)
GFR calc non Af Amer: >60 mL/min (ref >60)
GLUCOSE: 108 mg/dL — AB (ref 70–99)
Potassium: 4.5 mmol/L (ref 3.5–5.1)
SODIUM: 141 mmol/L (ref 135–145)
Total Bilirubin: 0.6 mg/dL (ref 0.3–1.2)
Total Protein: 7.3 g/dL (ref 6.5–8.1)

## 2018-05-29 MED ORDER — ONDANSETRON 4 MG PO TBDP: 4.0000 mg | ORAL_TABLET | Freq: Three times a day (TID) | ORAL | 0 refills | Status: DC | PRN

## 2018-05-29 MED ORDER — SODIUM CHLORIDE 0.9 % IV BOLUS
1000.0000 mL | Freq: Once | INTRAVENOUS | Status: AC
  Administered 2018-05-29: 1000 mL via INTRAVENOUS

## 2018-05-29 MED ORDER — METOCLOPRAMIDE HCL 5 MG/ML IJ SOLN
10.0000 mg | Freq: Once | INTRAMUSCULAR | Status: AC
  Administered 2018-05-29: 10 mg via INTRAVENOUS
  Filled 2018-05-29: qty 2

## 2018-05-29 NOTE — Discharge Instructions (Addendum)
If you develop fever, vomiting, abdominal pain, or any other new/concerning symptoms and return to the ER for evaluation.

## 2018-05-29 NOTE — ED Triage Notes (Signed)
Patient c/o headache, nausea, and lightheadedness since eating chinese today.

## 2018-05-29 NOTE — ED Provider Notes (Signed)
[facility]EM[facility]ENC[facility]PT Provider Note   [name]: [id number] Arrival date & time: 05/29/18  1752     History   Chief Complaint Chief Complaint  Patient presents with  . Headache  . Nausea    HPI Alison Ellison is a 27 y.o. female.  HPI  27 year old female presents with headache, dizziness, and nausea after eating [place] food.  She states that 5 minutes after eating takeout she developed the symptoms.  Occurred about 2 hours prior to arrival.  The dizziness and blurry vision that comes with it seems to come and go.  Feels like a room spinning sensation.  She also has a moderate frontal headache, 6 out of 10 in intensity.  This is less severe than the typical headaches she gets normally.  She also has some nausea without vomiting and feels abdominal bloating.  No abdominal pain or diarrhea.  She had a bowel movement prior to arrival that was normal.  No fevers.  History reviewed. No pertinent past medical history.  There are no active problems to display for this patient.   Past Surgical History:  Procedure Laterality Date  . CESAREAN SECTION       OB History   None      Home Medications    Prior to Admission medications   Medication Sig Start Date End Date Taking? Authorizing Provider  ibuprofen (ADVIL,MOTRIN) 800 MG tablet Take 800 mg by mouth every 8 (eight) hours as needed for mild pain.    Yes [provider]  Multiple Vitamin (MULTIVITAMIN WITH MINERALS) TABS tablet Take 1 tablet by mouth daily.   Yes [provider]  amoxicillin (AMOXIL) 500 MG capsule Take 2 capsules (1,000 mg total) by mouth 2 (two) times daily. Patient not taking: Reported on 05/29/2018 03/05/17   Hayden Rasmussen, NP  ondansetron (ZOFRAN ODT) 4 MG disintegrating tablet Take 1 tablet (4 mg total) by mouth every 8 (eight) hours as needed for nausea or vomiting. 05/29/18   Pricilla Loveless, MD    Family History No family history on file.  Social History Social  History   Tobacco Use  . Smoking status: Never Smoker  Substance Use Topics  . Alcohol use: No  . Drug use: No     Allergies   Aspirin   Review of Systems Review of Systems  Constitutional: Negative for fever.  Eyes: Positive for visual disturbance.  Respiratory: Negative for shortness of breath.   Cardiovascular: Negative for chest pain.  Gastrointestinal: Positive for nausea. Negative for abdominal pain, diarrhea and vomiting.  Genitourinary: Negative for dysuria.  Musculoskeletal: Negative for back pain.  Neurological: Positive for dizziness and headaches.  All other systems reviewed and are negative.    Physical Exam Updated Vital Signs BP 129/83 (BP Location: Left Arm)   Pulse 78   Temp 98.4 F (36.9 C) (Oral)   Resp 16   SpO2 99%   Physical Exam  Constitutional: She is oriented to person, place, and time. She appears well-developed and well-nourished.  Non-toxic appearance. She does not appear ill. No distress.  HENT:  Head: Normocephalic and atraumatic.  Right Ear: External ear normal.  Left Ear: External ear normal.  Nose: Nose normal.  Eyes: Pupils are equal, round, and reactive to light. EOM are normal. Right eye exhibits no discharge. Left eye exhibits no discharge.  Neck: Normal range of motion. Neck supple.  Cardiovascular: Normal rate, regular rhythm and normal heart sounds.  Pulmonary/Chest: Effort normal and breath sounds normal.  Abdominal:  Soft. She exhibits no distension. There is no tenderness.  Neurological: She is alert and oriented to person, place, and time.  CN 3-12 grossly intact. 5/5 strength in all 4 extremities. Grossly normal sensation. Normal finger to nose.   Skin: Skin is warm and dry.  Nursing note and vitals reviewed.    ED Treatments / Results  Labs (all labs ordered are listed, but only abnormal results are displayed) Labs Reviewed  COMPREHENSIVE METABOLIC PANEL - Abnormal; Notable for the following components:       Result Value   Glucose, Bld 108 (*)    Creatinine, Ser 1.07 (*)    All other components within normal limits  CBC WITH DIFFERENTIAL/PLATELET  I-STAT BETA HCG BLOOD, ED ([facility], [facility], [facility] ONLY)    EKG None  Radiology No results found.  Procedures Procedures (including critical care time)  Medications Ordered in ED Medications  sodium chloride 0.9 % bolus 1,000 mL (1,000 mLs Intravenous New Bag/Given 05/29/18 1853)  metoCLOPramide (REGLAN) injection 10 mg (10 mg Intravenous Given 05/29/18 1853)     Initial Impression / Assessment and Plan / ED Course  I have reviewed the triage vital signs and the nursing notes.  Pertinent labs & imaging results that were available during my care of the patient were reviewed by me and considered in my medical decision making (see chart for details).     Patient symptoms are likely related to the [place] food she just ate.  She is feeling much better after treatment.  Her labs are reassuring.  No acute neurologic findings on exam.  I highly doubt an acute CNS emergency.  No abdominal tenderness or vomiting.  She appears stable for discharge home with symptomatic care.  Return precautions.  Final Clinical Impressions(s) / ED Diagnoses   Final diagnoses:  Nausea in adult  Frontal headache    ED Discharge Orders         Ordered    ondansetron (ZOFRAN ODT) 4 MG disintegrating tablet  Every 8 hours PRN     05/29/18 1953           Pricilla LovelessGoldston, Titiana Severa, MD 05/29/18 1959

## 2018-06-17 ENCOUNTER — Emergency Department (HOSPITAL_COMMUNITY)
Admission: EM | Admit: 2018-06-17 | Discharge: 2018-06-17 | Disposition: A | Discharge status: Home / Self Care | Payer: 59 | Attending: Emergency Medicine | Admitting: Emergency Medicine

## 2018-06-17 ENCOUNTER — Other Ambulatory Visit: Payer: Self-pay

## 2018-06-17 ENCOUNTER — Encounter (HOSPITAL_COMMUNITY): Payer: Self-pay

## 2018-06-17 DIAGNOSIS — M25512 Pain in left shoulder: Secondary | ICD-10-CM

## 2018-06-17 DIAGNOSIS — M25511 Pain in right shoulder: Secondary | ICD-10-CM | POA: Insufficient documentation

## 2018-06-17 DIAGNOSIS — M62838 Other muscle spasm: Secondary | ICD-10-CM

## 2018-06-17 MED ORDER — CYCLOBENZAPRINE HCL 10 MG PO TABS: 10.0000 mg | ORAL_TABLET | Freq: Two times a day (BID) | ORAL | 0 refills | Status: DC | PRN

## 2018-06-17 MED ORDER — IBUPROFEN 600 MG PO TABS: 600.0000 mg | ORAL_TABLET | Freq: Four times a day (QID) | ORAL | 0 refills | Status: DC | PRN

## 2018-06-17 NOTE — ED Provider Notes (Signed)
[facility]PT Provider Note   [name]: [id number] Arrival date & time: 06/17/18  0533     History   Chief Complaint Chief Complaint  Patient presents with  . Shoulder Pain  . Back Pain    HPI Alison Ellison is a 27 y.o. female.  Patient here with complaint of sharp, intense left posterior shoulder pain that woke her from sleep tonight. No symptoms prior to going to bed. 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 No history of similar symptoms. No neck pain, weakness of the left UE, numbness.   The history is provided by the patient. No language interpreter was used.    History reviewed. No pertinent past medical history.  There are no active problems to display for this patient.   Past Surgical History:  Procedure Laterality Date  . CESAREAN SECTION       OB History   None      Home Medications    Prior to Admission medications   Medication Sig Start Date End Date Taking? Authorizing Provider  amoxicillin (AMOXIL) 500 MG capsule Take 2 capsules (1,000 mg total) by mouth 2 (two) times daily. Patient not taking: Reported on 05/29/2018 03/05/17   Hayden RasmussenMabe, David, NP  ibuprofen (ADVIL,MOTRIN) 800 MG tablet Take 800 mg by mouth every 8 (eight) hours as needed for mild pain.     [provider]  Multiple Vitamin (MULTIVITAMIN WITH MINERALS) TABS tablet Take 1 tablet by mouth daily.    [provider]  ondansetron (ZOFRAN ODT) 4 MG disintegrating tablet Take 1 tablet (4 mg total) by mouth every 8 (eight) hours as needed for nausea or vomiting. 05/29/18   Pricilla LovelessGoldston, Scott, MD    Family History History reviewed. No pertinent family history.  Social History Social History   Tobacco Use  . Smoking status: Never Smoker  Substance Use Topics  . Alcohol use: No  . Drug use: No     Allergies   Aspirin   Review of Systems Review of Systems  Constitutional:  Negative for diaphoresis.  Respiratory: Negative.  Negative for shortness of breath.   Cardiovascular: Negative.   Musculoskeletal:       See HPI.  Skin: Negative.   Neurological: Negative.  Negative for weakness and numbness.     Physical Exam Updated Vital Signs BP 123/89   Pulse 90   Temp 98.2 F (36.8 C)   Resp 16   SpO2 99%   Physical Exam  Constitutional: She is oriented to person, place, and time. She appears well-developed and well-nourished.  Neck: Normal range of motion.  Cardiovascular: Intact distal pulses.  Pulmonary/Chest: Effort normal and breath sounds normal. She has no wheezes. She has no rales.  Musculoskeletal: Normal range of motion.       Back:  Tender to palpation of parascapular muscle with palpable spasm. FROM of the UE, full grip strength. No midline cervical tenderness and no increased pain with neck movement.   Neurological: She is alert and oriented to person, place, and time. No sensory deficit.  Skin: Skin is warm and dry.     ED Treatments / Results  Labs (all labs ordered are listed, but only abnormal results are displayed) Labs Reviewed - No data to display  EKG None  Radiology No results found.  Procedures Procedures (including critical care time)  Medications Ordered in ED Medications - No data to display   Initial Impression /  Assessment and Plan / ED Course  I have reviewed the triage vital signs and the nursing notes.  Pertinent labs & imaging results that were available during my care of the patient were reviewed by me and considered in my medical decision making (see chart for details).     Patient with muscular left posterior shoulder pain with muscle spasm. Rx ibuprofen and Flexeril provided. REcommend follow up with PCP if symptoms persist.   Final Clinical Impressions(s) / ED Diagnoses   Final diagnoses:  None   1. Left shoulder pain 2. Muscular spasm  ED Discharge Orders    None       Elpidio Anis, PA-C 06/17/18 0602    Gilda Crease, MD 06/17/18 [phone]

## 2018-06-17 NOTE — ED Triage Notes (Signed)
Pt presents to ED from home for L shoulder and back pain. 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 Pt woke up with 10/10 pain.

## 2018-09-11 DIAGNOSIS — O1490 Unspecified pre-eclampsia, unspecified trimester: Secondary | ICD-10-CM | POA: Insufficient documentation

## 2019-10-24 ENCOUNTER — Ambulatory Visit: Payer: 59 | Attending: Internal Medicine

## 2019-10-24 LAB — NOVEL CORONAVIRUS, NAA: SARS-CoV-2, NAA: NOT DETECTED

## 2019-10-27 ENCOUNTER — Telehealth: Payer: Self-pay

## 2019-10-27 ENCOUNTER — Encounter: Payer: Self-pay | Admitting: *Deleted

## 2019-10-27 NOTE — Telephone Encounter (Signed)
Pt stated she was tested at [facility] on 10/24/19 and was calling for her Covid results. Nothing is showing under Labs that she was tested but an appt appears under Appts tab. She is a [employer] however she made an appt for community testing and was not sent there by [facility]. [facility] advised her to reach out to [facility] for results. Please advise.

## 2019-10-27 NOTE — Telephone Encounter (Signed)
Patient called and left message to return call. [facility] representative contacted and results located under Employee Health Account. COVID-19 test result from 10/24/19 was "Not Detected". Result report abstracted into chart.

## 2020-06-11 ENCOUNTER — Ambulatory Visit (HOSPITAL_COMMUNITY)
Admission: RE | Admit: 2020-06-11 | Discharge: 2020-06-11 | Disposition: A | Discharge status: Home / Self Care | Payer: Medicaid Other | Source: Ambulatory Visit | Attending: Family Medicine | Admitting: Family Medicine

## 2020-06-11 ENCOUNTER — Other Ambulatory Visit: Payer: Self-pay

## 2020-06-11 ENCOUNTER — Encounter (HOSPITAL_COMMUNITY): Payer: Self-pay

## 2020-06-11 VITALS — BP 117/68 | HR 89 | Temp 99.5°F | Resp 16 | Ht 59.0 in | Wt 162.0 lb

## 2020-06-11 DIAGNOSIS — Z3490 Encounter for supervision of normal pregnancy, unspecified, unspecified trimester: Secondary | ICD-10-CM | POA: Diagnosis present

## 2020-06-11 DIAGNOSIS — N760 Acute vaginitis: Secondary | ICD-10-CM | POA: Diagnosis present

## 2020-06-11 MED ORDER — METRONIDAZOLE 500 MG PO TABS: 500.0000 mg | ORAL_TABLET | Freq: Two times a day (BID) | ORAL | 0 refills | Status: DC

## 2020-06-11 MED ORDER — TERCONAZOLE 0.8 % VA CREA: 1.0000 | TOPICAL_CREAM | Freq: Every day | VAGINAL | 0 refills | Status: DC

## 2020-06-11 NOTE — Discharge Instructions (Addendum)
Take antibiotic 2 times a day for 7 days Use the terconazole cream as prescribed Check my chart for your test results Ask your OB/GYN about probiotics

## 2020-06-11 NOTE — ED Triage Notes (Signed)
Pt c/o vaginal itching, white dischargex2 days.

## 2020-06-11 NOTE — ED Provider Notes (Signed)
[facility]    [name]: [id number] Arrival date & time: 06/11/20  1848      History   Chief Complaint Chief Complaint  Patient presents with  . Vaginitis    HPI Alison Ellison is a 29 y.o. female.   HPI  Patient is [redacted] weeks pregnant.  She states she went to her OB/GYN for a checkup.  She had vaginitis.  She states they did a vaginal swab and a urinalysis.  They told her had a yeast infection and BV.  They are treated her with MetroGel and over-the-counter Monistat.  She states she used these medicines but the symptoms came back very quickly.  She thinks she still has the infection.  She would like to be retested and retreated.  No dysuria frequency or signs of bladder infection.  No abdominal pain or bleeding.  No fever or chills.  No nausea or vomiting.  History reviewed. No pertinent past medical history.  There are no problems to display for this patient.   Past Surgical History:  Procedure Laterality Date  . CESAREAN SECTION      OB History    Gravida  3   Para  1   Term      Preterm      AB      Living        SAB      TAB      Ectopic      Multiple      Live Births  1            Home Medications    Prior to Admission medications   Medication Sig Start Date End Date Taking? Authorizing Provider  Prenatal Vit-Fe Fumarate-FA (PRENATAL MULTIVITAMIN) TABS tablet Take 1 tablet by mouth daily at 12 noon.   Yes [provider]  metroNIDAZOLE (FLAGYL) 500 MG tablet Take 1 tablet (500 mg total) by mouth 2 (two) times daily. 06/11/20   Eustace Moore, MD  terconazole (TERAZOL 3) 0.8 % vaginal cream Place 1 applicator vaginally at bedtime. 06/11/20   Eustace Moore, MD    Family History No family history on file.  Social History Social History   Tobacco Use  . Smoking status: Never Smoker  Substance Use Topics  . Alcohol use: No  . Drug use: No     Allergies   Aspirin   Review of Systems Review of Systems See  HPI  Physical Exam Triage Vital Signs ED Triage Vitals [06/11/20 1917]  Enc Vitals Group     BP 117/68     Pulse Rate 89     Resp 16     Temp 99.5 F (37.5 C)     Temp Source Oral     SpO2 100 %     Weight 162 lb (73.5 kg)     Height 4\' 11"  (1.499 m)     Head Circumference      Peak Flow      Pain Score 0     Pain Loc      Pain Edu?      Excl. in GC?    No data found.  Updated Vital Signs BP 117/68   Pulse 89   Temp 99.5 F (37.5 C) (Oral)   Resp 16   Ht 4\' 11"  (1.499 m)   Wt 73.5 kg   SpO2 100%   BMI 32.72 kg/m  Physical Exam Constitutional:      General: She is not in acute  distress.    Appearance: She is well-developed and normal weight.  HENT:     Head: Normocephalic and atraumatic.     Mouth/Throat:     Comments: Mask is in place Eyes:     Conjunctiva/sclera: Conjunctivae normal.     Pupils: Pupils are equal, round, and reactive to light.  Cardiovascular:     Rate and Rhythm: Normal rate.  Pulmonary:     Effort: Pulmonary effort is normal. No respiratory distress.  Abdominal:     General: There is no distension.     Palpations: Abdomen is soft.     Tenderness: There is no right CVA tenderness or left CVA tenderness.  Musculoskeletal:        General: Normal range of motion.     Cervical back: Normal range of motion.  Skin:    General: Skin is warm and dry.  Neurological:     Mental Status: She is alert.  Psychiatric:        Behavior: Behavior normal.      UC Treatments / Results  Labs (all labs ordered are listed, but only abnormal results are displayed) Labs Reviewed - No data to display  EKG   Radiology No results found.  Procedures Procedures (including critical care time)  Medications Ordered in UC Medications - No data to display  Initial Impression / Assessment and Plan / UC Course  I have reviewed the triage vital signs and the nursing notes.  Pertinent labs & imaging results that were available during my care of the  patient were reviewed by me and considered in my medical decision making (see chart for details).     We will retest.  Will retreat.  Follow-up with OB/GYN Final Clinical Impressions(s) / UC Diagnoses   Final diagnoses:  Pregnancy at early stage  Vaginitis and vulvovaginitis     Discharge Instructions     Take antibiotic 2 times a day for 7 days Use the terconazole cream as prescribed Check my chart for your test results Ask your OB/GYN about probiotics   ED Prescriptions    Medication Sig Dispense Auth. Provider   metroNIDAZOLE (FLAGYL) 500 MG tablet Take 1 tablet (500 mg total) by mouth 2 (two) times daily. 14 tablet Eustace Moore, MD   terconazole (TERAZOL 3) 0.8 % vaginal cream Place 1 applicator vaginally at bedtime. 20 g Eustace Moore, MD     PDMP not reviewed this encounter.   Eustace Moore, MD 06/11/20 [phone]

## 2020-06-12 LAB — CERVICOVAGINAL ANCILLARY ONLY
Bacterial Vaginitis (gardnerella): POSITIVE — AB
Candida Glabrata: NEGATIVE
Candida Vaginitis: POSITIVE — AB
Chlamydia: NEGATIVE
Comment: NEGATIVE
Comment: NEGATIVE
Comment: NEGATIVE
Comment: NEGATIVE
Comment: NEGATIVE
Comment: NORMAL
Neisseria Gonorrhea: POSITIVE — AB
Trichomonas: NEGATIVE

## 2020-07-01 ENCOUNTER — Ambulatory Visit
Admission: EM | Admit: 2020-07-01 | Discharge: 2020-07-01 | Disposition: A | Discharge status: Home / Self Care | Payer: Medicaid Other

## 2020-07-01 ENCOUNTER — Encounter: Payer: Self-pay | Admitting: Emergency Medicine

## 2020-07-01 ENCOUNTER — Other Ambulatory Visit: Payer: Self-pay

## 2020-07-01 DIAGNOSIS — S161XXA Strain of muscle, fascia and tendon at neck level, initial encounter: Secondary | ICD-10-CM

## 2020-07-01 NOTE — Discharge Instructions (Signed)
May take Tylenol as needed for pain. Important to use ice to help with swelling, and a few days may use heat to help with muscle relaxation. [facility] any abdominal pain, pelvic pain, discharge or bleeding, or difficulty breathing, chest pain.
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
## 2020-07-04 ENCOUNTER — Ambulatory Visit
Admission: EM | Admit: 2020-07-04 | Discharge: 2020-07-04 | Disposition: A | Discharge status: Home / Self Care | Payer: Medicaid Other

## 2020-07-04 ENCOUNTER — Encounter: Payer: Self-pay | Admitting: Emergency Medicine

## 2020-07-04 DIAGNOSIS — Z7689 Persons encountering health services in other specified circumstances: Secondary | ICD-10-CM

## 2020-07-04 DIAGNOSIS — S161XXD Strain of muscle, fascia and tendon at neck level, subsequent encounter: Secondary | ICD-10-CM
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
## 2020-07-04 NOTE — Discharge Instructions (Signed)
Heat therapy (hot compress, warm wash rag, hot showers, etc.) can help relax muscles and soothe muscle aches. Cold therapy (ice packs) can be used to help swelling both after injury and after prolonged use of areas of chronic pain/aches.  Important to follow up with specialist(s) below for further evaluation/management if your symptoms persist or worsen.

## 2020-08-08 ENCOUNTER — Inpatient Hospital Stay (HOSPITAL_COMMUNITY)
Admission: AD | Admit: 2020-08-08 | Discharge: 2020-08-08 | Disposition: A | Discharge status: Home / Self Care | Payer: BC Managed Care – PPO | Attending: Obstetrics and Gynecology | Admitting: Obstetrics and Gynecology

## 2020-08-08 ENCOUNTER — Encounter (HOSPITAL_COMMUNITY): Payer: Self-pay | Admitting: Obstetrics and Gynecology

## 2020-08-08 ENCOUNTER — Other Ambulatory Visit: Payer: Self-pay

## 2020-08-08 DIAGNOSIS — O99891 Other specified diseases and conditions complicating pregnancy: Secondary | ICD-10-CM

## 2020-08-08 DIAGNOSIS — J029 Acute pharyngitis, unspecified: Secondary | ICD-10-CM | POA: Diagnosis not present

## 2020-08-08 DIAGNOSIS — R059 Cough, unspecified: Secondary | ICD-10-CM

## 2020-08-08 DIAGNOSIS — R070 Pain in throat: Secondary | ICD-10-CM

## 2020-08-08 DIAGNOSIS — Z3A18 18 weeks gestation of pregnancy: Secondary | ICD-10-CM | POA: Diagnosis not present

## 2020-08-08 DIAGNOSIS — R519 Headache, unspecified: Secondary | ICD-10-CM

## 2020-08-08 DIAGNOSIS — O26892 Other specified pregnancy related conditions, second trimester: Secondary | ICD-10-CM | POA: Insufficient documentation

## 2020-08-08 DIAGNOSIS — O219 Vomiting of pregnancy, unspecified: Secondary | ICD-10-CM | POA: Diagnosis not present

## 2020-08-08 DIAGNOSIS — T7840XA Allergy, unspecified, initial encounter: Secondary | ICD-10-CM

## 2020-08-08 DIAGNOSIS — R0981 Nasal congestion: Secondary | ICD-10-CM | POA: Insufficient documentation

## 2020-08-08 DIAGNOSIS — R1111 Vomiting without nausea: Secondary | ICD-10-CM

## 2020-08-08 LAB — URINALYSIS, ROUTINE W REFLEX MICROSCOPIC
Bilirubin Urine: NEGATIVE
Glucose, UA: NEGATIVE mg/dL
Hgb urine dipstick: NEGATIVE
Ketones, ur: NEGATIVE mg/dL
Nitrite: NEGATIVE
Protein, ur: NEGATIVE mg/dL
Specific Gravity, Urine: 1.012 (ref 1.005–1.030)
pH: 7 (ref 5.0–8.0)

## 2020-08-08 MED ORDER — FLUTICASONE PROPIONATE 50 MCG/ACT NA SUSP
2.0000 | Freq: Once | NASAL | Status: AC
  Administered 2020-08-08: 2 via NASAL
  Filled 2020-08-08: qty 16

## 2020-08-08 NOTE — MAU Provider Note (Signed)
Chief Complaint: Sore Throat   First Provider Initiated Contact with Patient 08/08/20 [phone]        SUBJECTIVE HPI: Alison Ellison is a 29 y.o. G3P1 at [redacted]w[redacted]d by LMP who presents to maternity admissions reporting nasal congestion related to allergies, sore throat this am and vomited once when brushing teeth.  Has only vomited 5 times this pregnancy.  Has Rx for Singulair because she cannot tolerate other antihistamines but has not taken any.  Had a headache which resolved by itself.  Did not take Tylenol because her stomach was empty. She denies vaginal bleeding, vaginal itching/burning, urinary symptoms, h/a, dizziness, n/v, or fever/chills.   Requests a work note to be out of work.   Sore Throat  This is a new problem. The current episode started today. The problem has been resolved. There has been no fever. Associated symptoms include congestion, headaches (this am, now resolved) and vomiting (once when brushing teeth). Pertinent negatives include no abdominal pain, coughing (coughed once), diarrhea, shortness of breath or trouble swallowing. She has tried nothing for the symptoms.   RN Note: Pt reports she has allergies and this am she awakened with sore throat, nasal congestion. Reports headache this am also.  Vomited one time when she was brushing her teeth and there was blood in the emesis.  No past medical history on file. Past Surgical History:  Procedure Laterality Date  . CESAREAN SECTION     Social History   Socioeconomic History  . Marital status: Single    Spouse name: Not on file  . Number of children: Not on file  . Years of education: Not on file  . Highest education level: Not on file  Occupational History  . Not on file  Tobacco Use  . Smoking status: Never Smoker  Substance and Sexual Activity  . Alcohol use: No  . Drug use: No  . Sexual activity: Yes    Birth control/protection: I.U.D.  Other Topics Concern  . Not on file  Social History Narrative  . Not on  file   Social Determinants of Health   Financial Resource Strain:   . Difficulty of Paying Living Expenses: Not on file  Food Insecurity:   . Worried About Programme researcher, broadcasting/film/video in the Last Year: Not on file  . Ran Out of Food in the Last Year: Not on file  Transportation Needs:   . Lack of Transportation (Medical): Not on file  . Lack of Transportation (Non-Medical): Not on file  Physical Activity:   . Days of Exercise per Week: Not on file  . Minutes of Exercise per Session: Not on file  Stress:   . Feeling of Stress : Not on file  Social Connections:   . Frequency of Communication with Friends and Family: Not on file  . Frequency of Social Gatherings with Friends and Family: Not on file  . Attends Religious Services: Not on file  . Active Member of Clubs or Organizations: Not on file  . Attends Banker Meetings: Not on file  . Marital Status: Not on file  Intimate Partner Violence:   . Fear of Current or Ex-Partner: Not on file  . Emotionally Abused: Not on file  . Physically Abused: Not on file  . Sexually Abused: Not on file   No current facility-administered medications on file prior to encounter.   Current Outpatient Medications on File Prior to Encounter  Medication Sig Dispense Refill  . Prenatal Vit-Fe Fumarate-FA (PRENATAL MULTIVITAMIN) TABS tablet  Take 1 tablet by mouth daily at 12 noon.     Allergies  Allergen Reactions  . Aspirin Other (See Comments)    " I feel funny"--per pt    I have reviewed patient's Past Medical Hx, Surgical Hx, Family Hx, Social Hx, medications and allergies.   ROS:  Review of Systems  HENT: Positive for congestion. Negative for trouble swallowing.   Respiratory: Negative for cough (coughed once) and shortness of breath.   Gastrointestinal: Positive for vomiting (once when brushing teeth). Negative for abdominal pain and diarrhea.  Neurological: Positive for headaches (this am, now resolved).   Review of Systems   Other systems negative   Physical Exam  Physical Exam Patient Vitals for the past 24 hrs:  BP Temp Temp src Pulse Resp SpO2 Height Weight  08/08/20 0600 127/71 98.6 F (37 C) Oral (!) 105 18 100 % 4\' 11"  (1.499 m) 74.8 kg   Constitutional: Well-developed, well-nourished female in no acute distress.  Cardiovascular: mild tachycardia Respiratory: normal effort, lungs CTAB, no wheezing GI: Abd soft, non-tender. Pos BS x 4 MS: Extremities nontender, no edema, normal ROM Neurologic: Alert and oriented x 4.  GU: Neg CVAT.  PELVIC EXAM: deferred  FHT 143 by doppler  LAB RESULTS Results for orders placed or performed during the hospital encounter of 08/08/20 (from the past 24 hour(s))  Urinalysis, Routine w reflex microscopic Urine, Clean Catch     Status: Abnormal   Collection Time: 08/08/20  5:53 AM  Result Value Ref Range   Color, Urine YELLOW YELLOW   APPearance CLEAR CLEAR   Specific Gravity, Urine 1.012 1.005 - 1.030   pH 7.0 5.0 - 8.0   Glucose, UA NEGATIVE NEGATIVE mg/dL   Hgb urine dipstick NEGATIVE NEGATIVE   Bilirubin Urine NEGATIVE NEGATIVE   Ketones, ur NEGATIVE NEGATIVE mg/dL   Protein, ur NEGATIVE NEGATIVE mg/dL   Nitrite NEGATIVE NEGATIVE   Leukocytes,Ua MODERATE (A) NEGATIVE   RBC / HPF 0-5 0 - 5 RBC/hpf   WBC, UA 6-10 0 - 5 WBC/hpf   Bacteria, UA RARE (A) NONE SEEN   Squamous Epithelial / LPF 0-5 0 - 5   Mucus PRESENT        IMAGING No results found.  MAU Management/MDM: Ordered Flonase nasal spray Since other symptoms have resolved, will not order Tylenol or antiemetic List given of safe meds in pregnancy Advised to take singulair as prescribed  ASSESSMENT Single IUP at [redacted]w[redacted]d Nasal congestion, known allergies Headache, resolved Sore throat, resolved Cough, resolved Vomiting x 1, resolved  PLAN Discharge home Flonase here Singulair at home followup with OB doctor  Pt stable at time of discharge. Encouraged to return here or to other  Urgent Care/ED if she develops worsening of symptoms, increase in pain, fever, or other concerning symptoms.    [redacted]w[redacted]d CNM, MSN Certified Nurse-Midwife 08/08/2020  6:22 AM

## 2020-08-08 NOTE — Discharge Instructions (Signed)
Allergies, Adult An allergy means that your body reacts to something that bothers it (allergen). It is not a normal reaction. This can happen from something that you:  Eat.  Breathe in.  Touch. You can have an allergy (be allergic) to:  Outdoor things, like: ? Pollen. ? Grass. ? Weeds.  Indoor things, like: ? Dust. ? Smoke. ? Pet dander.  Foods.  Medicines.  Things that bother your skin, like: ? Detergents. ? Chemicals. ? Latex.  Perfume.  Bugs. An allergy cannot spread from person to person (is not contagious). Follow these instructions at home:         Stay away from things that you know you are allergic to.  If you have allergies to things in the air, wash out your nose each day. Do it with one of these: ? A salt-water (saline) spray. ? A container (neti pot).  Take over-the-counter and prescription medicines only as told by your doctor.  Keep all follow-up visits as told by your doctor. This is important.  If you are at risk for a very bad allergy reaction (anaphylaxis), keep an auto-injector with you all the time. This is called an epinephrine injection. ? This is pre-measured medicine with a needle. You can put it into your skin by yourself. ? Right after you have a very bad allergy reaction, you or a person with you must give the medicine in less than a few minutes. This is an emergency.  If you have ever had a very bad allergy reaction, wear a medical alert bracelet or necklace. Your very bad allergy should be written on it. Contact a health care provider if:  Your symptoms do not get better with treatment. Get help right away if:  You have symptoms of a very bad allergy reaction. These include: ? A swollen mouth, tongue, or throat. ? Pain or tightness in your chest. ? Trouble breathing. ? Being short of breath. ? Dizziness. ? Fainting. ? Very bad pain in your belly (abdomen). ? Throwing up (vomiting). ? Watery poop  (diarrhea). Summary  An allergy means that your body reacts to something that bothers it (allergen). It is not a normal reaction.  Stay away from things that make your body react.  Take over-the-counter and prescription medicines only as told by your doctor.  If you are at risk for a very bad allergy reaction, carry an auto-injector (epinephrine injection) all the time. Also, wear a medical alert bracelet or necklace so people know about your allergy. This information is not intended to replace advice given to you by your health care provider. Make sure you discuss any questions you have with your health care provider. Document Revised: 01/25/2019 Document Reviewed: 01/19/2017 Elsevier Patient Education  2020 Elsevier Inc.   Nausea and Vomiting, Adult Nausea is the feeling that you have an upset stomach or that you are about to vomit. Vomiting is when stomach contents are thrown up and out of the mouth as a result of nausea. Vomiting can make you feel weak and cause you to become dehydrated. Dehydration can make you feel tired and thirsty, cause you to have a dry mouth, and decrease how often you urinate. Older adults and people with other diseases or a weak disease-fighting system (immune system) are at higher risk for dehydration. It is important to treat your nausea and vomiting as told by your health care provider. Follow these instructions at home: Watch your symptoms for any changes. Tell your health care provider about them.  Follow these instructions to care for yourself at home. Eating and drinking      Take an oral rehydration solution (ORS). This is a drink that is sold at pharmacies and retail stores.  Drink clear fluids slowly and in small amounts as you are able. Clear fluids include water, ice chips, low-calorie sports drinks, and fruit juice that has water added (diluted fruit juice).  Eat bland, easy-to-digest foods in small amounts as you are able. These foods include  bananas, applesauce, rice, lean meats, toast, and crackers.  Avoid fluids that contain a lot of sugar or caffeine, such as energy drinks, sports drinks, and soda.  Avoid alcohol.  Avoid spicy or fatty foods. General instructions  Take over-the-counter and prescription medicines only as told by your health care provider.  Drink enough fluid to keep your urine pale yellow.  Wash your hands often using soap and water. If soap and water are not available, use hand sanitizer.  Make sure that all people in your household wash their hands well and often.  Rest at home while you recover.  Watch your condition for any changes.  Breathe slowly and deeply when you feel nauseated.  Keep all follow-up visits as told by your health care provider. This is important. Contact a health care provider if:  Your symptoms get worse.  You have new symptoms.  You have a fever.  You cannot drink fluids without vomiting.  Your nausea does not go away after 2 days.  You feel light-headed or dizzy.  You have a headache.  You have muscle cramps.  You have a rash.  You have pain while urinating. Get help right away if:  You have pain in your chest, neck, arm, or jaw.  You feel extremely weak or you faint.  You have persistent vomiting.  You have vomit that is bright red or looks like black coffee grounds.  You have bloody or black stools or stools that look like tar.  You have a severe headache, a stiff neck, or both.  You have severe pain, cramping, or bloating in your abdomen.  You have difficulty breathing, or you are breathing very quickly.  Your heart is beating very quickly.  Your skin feels cold and clammy.  You feel confused.  You have signs of dehydration, such as: ? Dark urine, very little urine, or no urine. ? Cracked lips. ? Dry mouth. ? Sunken eyes. ? Sleepiness. ? Weakness. These symptoms may represent a serious problem that is an emergency. Do not wait to  see if the symptoms will go away. Get medical help right away. Call your local emergency services (911 in the [place] Do not drive yourself to the hospital. Summary  Nausea is the feeling that you have an upset stomach or that you are about to vomit. As nausea gets worse, it can lead to vomiting. Vomiting can make you feel weak and cause you to become dehydrated.  Follow instructions from your health care provider about eating and drinking to prevent dehydration.  Take over-the-counter and prescription medicines only as told by your health care provider.  Contact your health care provider if your symptoms get worse, or you have new symptoms.  Keep all follow-up visits as told by your health care provider. This is important. This information is not intended to replace advice given to you by your health care provider. Make sure you discuss any questions you have with your health care provider. Document Revised: 01/28/2019 Document Reviewed: 03/16/2018 Elsevier Patient  Education  2020 Elsevier Inc.  

## 2020-08-08 NOTE — MAU Note (Signed)
Pt reports she has allergies and this am she awakened with sore throat, nasal congestion. Reports headache this am also.  Vomited one time when she was brushing her teeth and there was blood in the emesis.

## 2020-09-13 ENCOUNTER — Inpatient Hospital Stay (HOSPITAL_COMMUNITY): Payer: BC Managed Care – PPO

## 2020-09-13 ENCOUNTER — Other Ambulatory Visit: Payer: Self-pay

## 2020-09-13 ENCOUNTER — Inpatient Hospital Stay (HOSPITAL_COMMUNITY)
Admission: AD | Admit: 2020-09-13 | Discharge: 2020-09-13 | Disposition: A | Discharge status: Home / Self Care | Payer: BC Managed Care – PPO | Attending: Obstetrics and Gynecology | Admitting: Obstetrics and Gynecology

## 2020-09-13 DIAGNOSIS — U071 COVID-19: Secondary | ICD-10-CM

## 2020-09-13 DIAGNOSIS — O98512 Other viral diseases complicating pregnancy, second trimester: Secondary | ICD-10-CM | POA: Insufficient documentation

## 2020-09-13 DIAGNOSIS — Z886 Allergy status to analgesic agent status: Secondary | ICD-10-CM | POA: Diagnosis not present

## 2020-09-13 DIAGNOSIS — O99512 Diseases of the respiratory system complicating pregnancy, second trimester: Secondary | ICD-10-CM | POA: Diagnosis present

## 2020-09-13 DIAGNOSIS — J069 Acute upper respiratory infection, unspecified: Secondary | ICD-10-CM

## 2020-09-13 LAB — CBC WITH DIFFERENTIAL/PLATELET
Abs Immature Granulocytes: 0.24 10*3/uL — ABNORMAL HIGH (ref 0.00–0.07)
Basophils Absolute: 0.1 10*3/uL (ref 0.0–0.1)
Basophils Relative: 0 %
Eosinophils Absolute: 0 10*3/uL (ref 0.0–0.5)
Eosinophils Relative: 0 %
HCT: 32.7 % — ABNORMAL LOW (ref 36.0–46.0)
Hemoglobin: 11.1 g/dL — ABNORMAL LOW (ref 12.0–15.0)
Immature Granulocytes: 2 %
Lymphocytes Relative: 8 %
Lymphs Abs: 0.9 10*3/uL (ref 0.7–4.0)
MCH: 28.6 pg (ref 26.0–34.0)
MCHC: 33.9 g/dL (ref 30.0–36.0)
MCV: 84.3 fL (ref 80.0–100.0)
Monocytes Absolute: 0.7 10*3/uL (ref 0.1–1.0)
Monocytes Relative: 7 %
Neutro Abs: 9.2 10*3/uL — ABNORMAL HIGH (ref 1.7–7.7)
Neutrophils Relative %: 83 %
Platelets: 188 10*3/uL (ref 150–400)
RBC: 3.88 MIL/uL (ref 3.87–5.11)
RDW: 13.2 % (ref 11.5–15.5)
WBC: 11.2 10*3/uL — ABNORMAL HIGH (ref 4.0–10.5)
nRBC: 0 % (ref 0.0–0.2)

## 2020-09-13 LAB — RESP PANEL BY RT-PCR (FLU A&B, COVID) ARPGX2
Influenza A by PCR: NEGATIVE
Influenza B by PCR: NEGATIVE
SARS Coronavirus 2 by RT PCR: POSITIVE — AB

## 2020-09-13 LAB — GROUP A STREP BY PCR: Group A Strep by PCR: NOT DETECTED

## 2020-09-13 MED ORDER — ACETAMINOPHEN 325 MG PO TABS
650.0000 mg | ORAL_TABLET | Freq: Once | ORAL | Status: AC
  Administered 2020-09-13: 650 mg via ORAL
  Filled 2020-09-13: qty 2

## 2020-09-13 NOTE — MAU Note (Signed)
Reports painful cough, chest congestion, sore throat, denies fever. Denies abd pain, contractions, bleeding.

## 2020-09-13 NOTE — MAU Provider Note (Addendum)
Patient Alison Ellison is a 29 y.o. G3P1  at [redacted]w[redacted]d here with complaints of cough, chest pain with coughing, sore throat and congestion. She denies contractions, vaginal bleeding, decreased fetal movements, dysuria or other ob-gyn complaints. She has had allergies since October, but feels like her cough and sore throat got worse on Monday night after she slept in front of a fan. She has not been vaccinated against COVID -19 and does not know if she has been exposed.  History     [name]: [id number]  Arrival date and time: 09/13/20 4403   First Provider Initiated Contact with Patient 09/13/20 [phone]      Chief Complaint  Patient presents with  . Cough  . Sore Throat  . Nasal Congestion   Cough This is a chronic problem. The current episode started in the past 7 days. The problem has been gradually worsening. The problem occurs constantly. The cough is productive of sputum. Associated symptoms include chest pain, postnasal drip and a sore throat. She has tried nothing for the symptoms.  She also reports that her throat feels sore; cough drops have helped. She works in a [employer].   OB History    Gravida  3   Para  1   Term      Preterm      AB      Living        SAB      TAB      Ectopic      Multiple      Live Births  1           No past medical history on file.  Past Surgical History:  Procedure Laterality Date  . CESAREAN SECTION      No family history on file.  Social History   Tobacco Use  . Smoking status: Never Smoker  Substance Use Topics  . Alcohol use: No  . Drug use: No    Allergies:  Allergies  Allergen Reactions  . Aspirin Other (See Comments)    " I feel funny"--per pt    Medications Prior to Admission  Medication Sig Dispense Refill Last Dose  . Prenatal Vit-Fe Fumarate-FA (PRENATAL MULTIVITAMIN) TABS tablet Take 1 tablet by mouth daily at 12 noon.       Review of Systems  Constitutional: Negative.   HENT: Positive for  postnasal drip and sore throat.   Respiratory: Positive for cough.   Cardiovascular: Positive for chest pain.  Genitourinary: Negative.   Neurological: Negative.   Hematological: Negative.    Physical Exam   Blood pressure 128/70, pulse (!) 123, temperature 100.2 F (37.9 C), temperature source Oral, resp. rate 19, height 4\' 11"  (1.499 m), weight 77.6 kg, last menstrual period 03/30/2020, SpO2 98 %.  Physical Exam Constitutional:      Appearance: She is well-developed.  HENT:     Nose: Congestion present.     Mouth/Throat:     Tonsils: No tonsillar exudate or tonsillar abscesses.     Comments: Oropharynx is erythematoous, no exudate Cardiovascular:     Rate and Rhythm: Normal rate and regular rhythm.  Pulmonary:     Effort: Pulmonary effort is normal. No respiratory distress.     Breath sounds: Normal breath sounds. No stridor.  Abdominal:     Palpations: Abdomen is soft.  Skin:    General: Skin is warm.  Neurological:     Mental Status: She is alert.     MAU Course  Procedures  MDM -  will do 20 min NST: 150 bpm, minimal var, present acel, no decels, no contractions -Chest xray reassuring -COVID pending  Flu negative  -strep A negative -UA reassuring -VSS stable while in MAU, after Tylenol and rest patient reports that she felt " a little better". Assessment and Plan   1. Viral upper respiratory tract infection     -discharged home with strict isolation and quarantine requirements -recommended comfort measures and safe medications in pregnancy. Reviewed when to return to MAU -COVID test pending; will notify patient when her test results are back -All questions answered  Charlesetta Garibaldi [facility] 09/13/2020, 3:49 AM

## 2020-09-13 NOTE — Discharge Instructions (Signed)
-watch for COVId Results on MY Chart (should have results sometime during the day on Thursday). If positive, call your OB-GYN to arrange for possible treatment.   3 Key Steps to Take While Waiting for Your COVID-19 Test Result To help stop the spread of COVID-19, take these 3 key steps NOW while waiting for your test results: 1. Stay home and monitor your health. Stay home and monitor your health to help protect your friends, family, and others from possibly getting COVID-19 from you. Stay home and away from others:  If possible, stay away from others, especially people who are at higher risk for getting very sick from COVID-19, such as older adults and people with other medical conditions.  If you have been in contact with someone with COVID-19, stay home and away from others for 14 days after your last contact with that person.  If you have a fever, cough or other symptoms of COVID-19, stay home and away from others (except to get medical care). Monitor your health:  Watch for fever, cough, shortness of breath, or other symptoms of COVID-19. Remember, symptoms may appear 2-14 days after exposure to COVID-19 and can include: ? Fever or chills ? Cough ? Shortness of breath or difficulty breathing ? Tiredness ? Muscle or body aches ? Headache ? New loss of taste or smell ? Sore throat ? Congestion or runny nose ? Nausea or vomiting ? Diarrhea 2. Think about the people you have recently been around. If you are diagnosed with COVID-19, a public health worker may call you to check on your health, discuss who you have been around, and ask where you spent time while you may have been able to spread COVID-19 to others. While you wait for your COVID-19 test result, think about everyone you have been around recently. This will be important information to give health workers if your test is positive.  Complete the information on the back of this page to help you remember everyone you have been  around. 3. Answer the phone call from the health department. If a public health worker calls you, answer the call to help slow the spread of COVID-19 in your community.  Discussions with health department staff are confidential. This means that your personal and medical information will be kept private and only shared with those who may need to know, like your health care provider.  Your name will not be shared with those you came in contact with. The health department will only notify people you were in close contact with (within 6 feet for more than 15 minutes) that they might have been exposed to COVID-19. Think about the people you have recently been around If you test positive and are diagnosed with COVID-19, someone from the health department may call to check-in on your health, discuss who you have been around, and ask where you spent time while you may have been able to spread COVID-19 to others. This form can help you think about people you have recently been around so you will be ready if a public health worker calls you. Things to think about. Have you:  Gone to work or school?  Gotten together with others (eaten out at [employer], gone out for drinks, exercised with others or gone to a gym, had friends or family over to your house, volunteered, gone to a party, pool, or park)?  Gone to a store in person (e.g., grocery store, mall)?  Gone to in-person appointments (e.g., salon, barber, doctor's  or dentist's office)?  Ridden in a car with others (e.g., [employer] or [employer]) or took public transportation?  Been inside a church, synagogue, mosque or other places of worship? Who lives with you?  ______________________________________________________________________  ______________________________________________________________________  ______________________________________________________________________  ______________________________________________________________________ Who  have you been around (within 6 feet for more than 15 minutes) in the last 10 days? (You may have more people to list than the space provided. If so, write on the front of this sheet or a separate piece of paper.) Name ______________________________________________  Phone number ____________________________________  Date you last saw them _____________________________  Where you last saw them ________________________________________________ Name ______________________________________________  Phone number ____________________________________  Date you last saw them _____________________________  Where you last saw them ________________________________________________ Name ______________________________________________  Phone number ____________________________________  Date you last saw them _____________________________  Where you last saw them ________________________________________________ Name ______________________________________________  Phone number ____________________________________  Date you last saw them _____________________________  Where you last saw them ________________________________________________ Name ______________________________________________  Phone number ____________________________________  Date you last saw them _____________________________  Where you last saw them ________________________________________________ What have you done in the last 10 days with other people? Activity _____________________________________________  Location _________________________________________  Date ____________________________________________ Activity _____________________________________________  Location _________________________________________  Date ____________________________________________ Activity _____________________________________________  Location _________________________________________  Date  ____________________________________________ Activity _____________________________________________  Location _________________________________________  Date ____________________________________________ Activity _____________________________________________  Location _________________________________________  Date ____________________________________________ [email or web address] 05/16/2019 This information is not intended to replace advice given to you by your health care provider. Make sure you discuss any questions you have with your health care provider. Document Revised: 09/22/2019 Document Reviewed: 09/22/2019 Elsevier Patient Education  2020 Elsevier Inc. watch MY Chart for Covid results

## 2020-09-14 ENCOUNTER — Telehealth: Payer: Self-pay | Admitting: Student

## 2020-09-14 ENCOUNTER — Other Ambulatory Visit: Payer: Self-pay | Admitting: Physician Assistant

## 2020-09-14 ENCOUNTER — Telehealth (HOSPITAL_COMMUNITY): Payer: Self-pay

## 2020-09-14 DIAGNOSIS — U071 COVID-19: Secondary | ICD-10-CM

## 2020-09-14 DIAGNOSIS — Z3A24 24 weeks gestation of pregnancy: Secondary | ICD-10-CM

## 2020-09-14 NOTE — Telephone Encounter (Signed)
Called patient to pre-screen for monoclonal antibody infusion after receiving recent positive test. Patient qualifies based off off co-morbid condition and/or member of an at risk group. Patient is 6 months pregnant. Symptom onset 11/22; cough, chills, loss of appetite  Patient Active Problem List   Diagnosis Date Noted  . COVID-19 09/14/2020    Patient is interested in learning more about the infusion. RN forwarded information to APP's for additional screening/scheduling.   Armida Vickroy Loyola Mast, RN

## 2020-09-14 NOTE — Telephone Encounter (Signed)
Called patient and notified her of positive COVID-19 results. Patient upset and asking what to do about her daughter. Recommended that patient call pediatrician and discuss quarantine guidelines, as well as contact school nurse or review policy as to when child can return to school and when testing should be done.   Reviewed quarantine and isolation precautions for patient herself, patient's quarantine ends after Day 10 of first symptoms.   Patient should notify all recent close contacts.  Offered Mabs, and patient would like to treatment. Will send message to [facility]abs group to schedule patient at infusion center.   Reviewed return precuations and when to come to back MAU, especially SOB or difficulty breathing. All questions answered.

## 2020-09-14 NOTE — Telephone Encounter (Signed)
TC to Dr. Claiborne Billings to discuss patient's covid-19 test results. Updated Dr. Claiborne Billings on patient's reassuring x-ray and status, patient knows that she must have virtual visits until her quarantine ends. Dr. Claiborne Billings will notify office staff and reschedule patient's appointment.   Luna Kitchens

## 2020-09-14 NOTE — Progress Notes (Signed)
I connected by phone with Alison Ellison on 09/14/2020 at 12:30 PM to discuss the potential use of a new treatment for mild to moderate COVID-19 viral infection in non-hospitalized patients.  This patient is a 29 y.o. female that meets the FDA criteria for Emergency Use Authorization of COVID monoclonal antibody sotrovimab, casirivimab/imdevimab or bamlamivimab/estevimab.  Has a (+) direct SARS-CoV-2 viral test result  Has mild or moderate COVID-19   Is NOT hospitalized due to COVID-19  Is within 10 days of symptom onset  Has at least one of the high risk factor(s) for progression to severe COVID-19 and/or hospitalization as defined in EUA.  Specific high risk criteria : BMI > 25, Pregnancy and Other high risk medical condition per CDC:  high SVI   I have spoken and communicated the following to the patient or parent/caregiver regarding COVID monoclonal antibody treatment:  1. FDA has authorized the emergency use for the treatment of mild to moderate COVID-19 in adults and pediatric patients with positive results of direct SARS-CoV-2 viral testing who are 70 years of age and older weighing at least 40 kg, and who are at high risk for progressing to severe COVID-19 and/or hospitalization.  2. The significant known and potential risks and benefits of COVID monoclonal antibody, and the extent to which such potential risks and benefits are unknown.  3. Information on available alternative treatments and the risks and benefits of those alternatives, including clinical trials.  4. Patients treated with COVID monoclonal antibody should continue to self-isolate and use infection control measures (e.g., wear mask, isolate, social distance, avoid sharing personal items, clean and disinfect "high touch" surfaces, and frequent handwashing) according to CDC guidelines.   5. The patient or parent/caregiver has the option to accept or refuse COVID monoclonal antibody treatment.  After reviewing this  information with the patient, the patient has agreed to receive one of the available covid 19 monoclonal antibodies and will be provided an appropriate fact sheet prior to infusion.  Sx onset 11/22. Set up for infusion on 11/27 @ 11:30pm. Directions given to [facility]. Pt is aware that insurance will be charged an infusion fee. Pt is unvaccinated and pregnant.   Cline Crock 09/14/2020 12:30 PM

## 2020-09-15 ENCOUNTER — Ambulatory Visit (HOSPITAL_COMMUNITY)
Admission: RE | Admit: 2020-09-15 | Discharge: 2020-09-15 | Disposition: A | Discharge status: Home / Self Care | Payer: BC Managed Care – PPO | Source: Ambulatory Visit | Attending: Pulmonary Disease | Admitting: Pulmonary Disease

## 2020-09-15 DIAGNOSIS — Z3A24 24 weeks gestation of pregnancy: Secondary | ICD-10-CM | POA: Insufficient documentation

## 2020-09-15 DIAGNOSIS — O98512 Other viral diseases complicating pregnancy, second trimester: Secondary | ICD-10-CM | POA: Insufficient documentation

## 2020-09-15 DIAGNOSIS — U071 COVID-19: Secondary | ICD-10-CM | POA: Diagnosis not present

## 2020-09-15 MED ORDER — EPINEPHRINE 0.3 MG/0.3ML IJ SOAJ: 0.3000 mg | Freq: Once | INTRAMUSCULAR | Status: DC | PRN

## 2020-09-15 MED ORDER — METHYLPREDNISOLONE SODIUM SUCC 125 MG IJ SOLR: 125.0000 mg | Freq: Once | INTRAMUSCULAR | Status: DC | PRN

## 2020-09-15 MED ORDER — ACETAMINOPHEN 325 MG PO TABS
650.0000 mg | ORAL_TABLET | Freq: Four times a day (QID) | ORAL | Status: DC | PRN
  Administered 2020-09-15: 650 mg via ORAL

## 2020-09-15 MED ORDER — DIPHENHYDRAMINE HCL 50 MG/ML IJ SOLN: 50.0000 mg | Freq: Once | INTRAMUSCULAR | Status: DC | PRN

## 2020-09-15 MED ORDER — FAMOTIDINE IN NACL 20-0.9 MG/50ML-% IV SOLN: 20.0000 mg | Freq: Once | INTRAVENOUS | Status: DC | PRN

## 2020-09-15 MED ORDER — SODIUM CHLORIDE 0.9 % IV SOLN: INTRAVENOUS | Status: DC | PRN

## 2020-09-15 MED ORDER — SOTROVIMAB 500 MG/8ML IV SOLN
500.0000 mg | Freq: Once | INTRAVENOUS | Status: AC
  Administered 2020-09-15: 500 mg via INTRAVENOUS

## 2020-09-15 MED ORDER — ALBUTEROL SULFATE HFA 108 (90 BASE) MCG/ACT IN AERS: 2.0000 | INHALATION_SPRAY | Freq: Once | RESPIRATORY_TRACT | Status: DC | PRN

## 2020-09-15 NOTE — Progress Notes (Signed)
Diagnosis: COVID-19  Physician: Dr. Patrick Wright  Procedure: Covid Infusion Clinic Med: Sotrovimab infusion - Provided patient with sotrovimab fact sheet for patients, parents, and caregivers prior to infusion.   Complications: No immediate complications noted  Discharge: Discharged home    

## 2020-09-15 NOTE — Progress Notes (Signed)
Patient reviewed Fact Sheet for Patients, Parents, and Caregivers for Emergency Use Authorization (EUA) of Sotrovimab for the Treatment of Coronavirus. Patient also reviewed and is agreeable to the estimated cost of treatment. Patient is agreeable to proceed.   

## 2020-09-15 NOTE — Discharge Instructions (Signed)

## 2020-10-15 ENCOUNTER — Other Ambulatory Visit: Payer: Self-pay | Admitting: Obstetrics & Gynecology

## 2020-10-15 DIAGNOSIS — Z363 Encounter for antenatal screening for malformations: Secondary | ICD-10-CM

## 2020-11-09 ENCOUNTER — Ambulatory Visit: Payer: BC Managed Care – PPO | Admitting: *Deleted

## 2020-11-09 ENCOUNTER — Other Ambulatory Visit: Payer: Self-pay

## 2020-11-09 ENCOUNTER — Other Ambulatory Visit: Payer: Self-pay | Admitting: *Deleted

## 2020-11-09 ENCOUNTER — Ambulatory Visit: Payer: BC Managed Care – PPO | Attending: Obstetrics & Gynecology

## 2020-11-09 ENCOUNTER — Encounter: Payer: Self-pay | Admitting: *Deleted

## 2020-11-09 ENCOUNTER — Other Ambulatory Visit: Payer: Self-pay | Admitting: Obstetrics & Gynecology

## 2020-11-09 VITALS — BP 126/84 | HR 107

## 2020-11-09 DIAGNOSIS — Z8616 Personal history of COVID-19: Secondary | ICD-10-CM | POA: Diagnosis present

## 2020-11-09 DIAGNOSIS — Z363 Encounter for antenatal screening for malformations: Secondary | ICD-10-CM

## 2020-11-09 DIAGNOSIS — O365990 Maternal care for other known or suspected poor fetal growth, unspecified trimester, not applicable or unspecified: Secondary | ICD-10-CM

## 2020-11-16 ENCOUNTER — Ambulatory Visit: Payer: BC Managed Care – PPO | Admitting: *Deleted

## 2020-11-16 ENCOUNTER — Ambulatory Visit: Payer: BC Managed Care – PPO | Attending: Obstetrics

## 2020-11-16 ENCOUNTER — Encounter: Payer: Self-pay | Admitting: *Deleted

## 2020-11-16 ENCOUNTER — Other Ambulatory Visit: Payer: Self-pay

## 2020-11-16 VITALS — BP 131/69 | HR 70

## 2020-11-16 DIAGNOSIS — Z3A33 33 weeks gestation of pregnancy: Secondary | ICD-10-CM | POA: Diagnosis not present

## 2020-11-16 DIAGNOSIS — O365930 Maternal care for other known or suspected poor fetal growth, third trimester, not applicable or unspecified: Secondary | ICD-10-CM | POA: Insufficient documentation

## 2020-11-16 DIAGNOSIS — O365990 Maternal care for other known or suspected poor fetal growth, unspecified trimester, not applicable or unspecified: Secondary | ICD-10-CM | POA: Insufficient documentation

## 2020-11-23 ENCOUNTER — Inpatient Hospital Stay (HOSPITAL_COMMUNITY): Payer: BC Managed Care – PPO | Admitting: Anesthesiology

## 2020-11-23 ENCOUNTER — Other Ambulatory Visit: Payer: Self-pay

## 2020-11-23 ENCOUNTER — Ambulatory Visit: Payer: BC Managed Care – PPO | Admitting: *Deleted

## 2020-11-23 ENCOUNTER — Encounter (HOSPITAL_COMMUNITY): Payer: Self-pay | Admitting: Anesthesiology

## 2020-11-23 ENCOUNTER — Encounter (HOSPITAL_COMMUNITY)
Admission: AD | Disposition: A | Discharge status: Home / Self Care | Payer: Self-pay | Source: Home / Self Care | Attending: Obstetrics and Gynecology

## 2020-11-23 ENCOUNTER — Other Ambulatory Visit: Payer: Self-pay | Admitting: Obstetrics

## 2020-11-23 ENCOUNTER — Encounter (HOSPITAL_COMMUNITY): Payer: Self-pay | Admitting: Obstetrics and Gynecology

## 2020-11-23 ENCOUNTER — Inpatient Hospital Stay (HOSPITAL_COMMUNITY)
Admission: AD | Admit: 2020-11-23 | Discharge: 2020-11-27 | DRG: 788 | Disposition: A | Discharge status: Home / Self Care | Payer: BC Managed Care – PPO | Attending: Obstetrics and Gynecology | Admitting: Obstetrics and Gynecology

## 2020-11-23 ENCOUNTER — Ambulatory Visit (HOSPITAL_BASED_OUTPATIENT_CLINIC_OR_DEPARTMENT_OTHER): Payer: BC Managed Care – PPO

## 2020-11-23 ENCOUNTER — Encounter: Payer: Self-pay | Admitting: *Deleted

## 2020-11-23 VITALS — BP 151/71 | HR 62

## 2020-11-23 DIAGNOSIS — O99213 Obesity complicating pregnancy, third trimester: Secondary | ICD-10-CM | POA: Diagnosis not present

## 2020-11-23 DIAGNOSIS — O9902 Anemia complicating childbirth: Secondary | ICD-10-CM | POA: Diagnosis present

## 2020-11-23 DIAGNOSIS — Z363 Encounter for antenatal screening for malformations: Secondary | ICD-10-CM

## 2020-11-23 DIAGNOSIS — O34211 Maternal care for low transverse scar from previous cesarean delivery: Secondary | ICD-10-CM | POA: Diagnosis present

## 2020-11-23 DIAGNOSIS — O99824 Streptococcus B carrier state complicating childbirth: Secondary | ICD-10-CM | POA: Diagnosis present

## 2020-11-23 DIAGNOSIS — O365930 Maternal care for other known or suspected poor fetal growth, third trimester, not applicable or unspecified: Secondary | ICD-10-CM | POA: Diagnosis present

## 2020-11-23 DIAGNOSIS — Z3A34 34 weeks gestation of pregnancy: Secondary | ICD-10-CM

## 2020-11-23 DIAGNOSIS — D573 Sickle-cell trait: Secondary | ICD-10-CM | POA: Diagnosis present

## 2020-11-23 DIAGNOSIS — O365990 Maternal care for other known or suspected poor fetal growth, unspecified trimester, not applicable or unspecified: Secondary | ICD-10-CM | POA: Insufficient documentation

## 2020-11-23 DIAGNOSIS — R030 Elevated blood-pressure reading, without diagnosis of hypertension: Secondary | ICD-10-CM | POA: Diagnosis present

## 2020-11-23 DIAGNOSIS — Z8616 Personal history of COVID-19: Secondary | ICD-10-CM

## 2020-11-23 DIAGNOSIS — Z98891 History of uterine scar from previous surgery: Secondary | ICD-10-CM

## 2020-11-23 DIAGNOSIS — O1414 Severe pre-eclampsia complicating childbirth: Secondary | ICD-10-CM | POA: Diagnosis present

## 2020-11-23 LAB — PROTEIN / CREATININE RATIO, URINE
Creatinine, Urine: 84.64 mg/dL
Protein Creatinine Ratio: 0.34 mg/mg{Cre} — ABNORMAL HIGH (ref 0.00–0.15)
Total Protein, Urine: 29 mg/dL

## 2020-11-23 LAB — CBC
HCT: 35.1 % — ABNORMAL LOW (ref 36.0–46.0)
Hemoglobin: 11.9 g/dL — ABNORMAL LOW (ref 12.0–15.0)
MCH: 29 pg (ref 26.0–34.0)
MCHC: 33.9 g/dL (ref 30.0–36.0)
MCV: 85.4 fL (ref 80.0–100.0)
Platelets: 200 10*3/uL (ref 150–400)
RBC: 4.11 MIL/uL (ref 3.87–5.11)
RDW: 14.7 % (ref 11.5–15.5)
WBC: 8.7 10*3/uL (ref 4.0–10.5)
nRBC: 0 % (ref 0.0–0.2)

## 2020-11-23 LAB — COMPREHENSIVE METABOLIC PANEL
ALT: 17 U/L (ref 0–44)
AST: 18 U/L (ref 15–41)
Albumin: 2.9 g/dL — ABNORMAL LOW (ref 3.5–5.0)
Alkaline Phosphatase: 65 U/L (ref 38–126)
Anion gap: 9 (ref 5–15)
BUN: 6 mg/dL (ref 6–20)
CO2: 21 mmol/L — ABNORMAL LOW (ref 22–32)
Calcium: 8.9 mg/dL (ref 8.9–10.3)
Chloride: 106 mmol/L (ref 98–111)
Creatinine, Ser: 0.72 mg/dL (ref 0.44–1.00)
GFR, Estimated: >60 mL/min (ref >60)
Glucose, Bld: 90 mg/dL (ref 70–99)
Potassium: 3.5 mmol/L (ref 3.5–5.1)
Sodium: 136 mmol/L (ref 135–145)
Total Bilirubin: 0.4 mg/dL (ref 0.3–1.2)
Total Protein: 6.7 g/dL (ref 6.5–8.1)

## 2020-11-23 LAB — TYPE AND SCREEN
ABO/RH(D): AB POS
Antibody Screen: NEGATIVE

## 2020-11-23 SURGERY — Surgical Case
Anesthesia: Spinal

## 2020-11-23 MED ORDER — OXYTOCIN-SODIUM CHLORIDE 30-0.9 UT/500ML-% IV SOLN
INTRAVENOUS | Status: AC
  Filled 2020-11-23: qty 500

## 2020-11-23 MED ORDER — KETOROLAC TROMETHAMINE 30 MG/ML IJ SOLN
30.0000 mg | Freq: Once | INTRAMUSCULAR | Status: AC | PRN
  Administered 2020-11-23: 30 mg via INTRAVENOUS

## 2020-11-23 MED ORDER — LABETALOL HCL 5 MG/ML IV SOLN
20.0000 mg | INTRAVENOUS | Status: DC | PRN
  Administered 2020-11-23: 20 mg via INTRAVENOUS
  Filled 2020-11-23: qty 4

## 2020-11-23 MED ORDER — BUPIVACAINE IN DEXTROSE 0.75-8.25 % IT SOLN
INTRATHECAL | Status: DC | PRN
  Administered 2020-11-23: 1.6 mL via INTRATHECAL

## 2020-11-23 MED ORDER — FENTANYL CITRATE (PF) 100 MCG/2ML IJ SOLN
INTRAMUSCULAR | Status: AC
  Filled 2020-11-23: qty 2

## 2020-11-23 MED ORDER — LABETALOL HCL 5 MG/ML IV SOLN: 80.0000 mg | INTRAVENOUS | Status: DC | PRN

## 2020-11-23 MED ORDER — ONDANSETRON HCL 4 MG/2ML IJ SOLN
INTRAMUSCULAR | Status: DC | PRN
  Administered 2020-11-23: 4 mg via INTRAVENOUS

## 2020-11-23 MED ORDER — KETOROLAC TROMETHAMINE 30 MG/ML IJ SOLN: 30.0000 mg | Freq: Four times a day (QID) | INTRAMUSCULAR | Status: DC | PRN

## 2020-11-23 MED ORDER — MORPHINE SULFATE (PF) 0.5 MG/ML IJ SOLN
INTRAMUSCULAR | Status: AC
  Filled 2020-11-23: qty 10

## 2020-11-23 MED ORDER — NALBUPHINE HCL 10 MG/ML IJ SOLN: 5.0000 mg | INTRAMUSCULAR | Status: DC | PRN

## 2020-11-23 MED ORDER — MAGNESIUM SULFATE 40 GM/1000ML IV SOLN
INTRAVENOUS | Status: AC
  Filled 2020-11-23: qty 1000

## 2020-11-23 MED ORDER — MAGNESIUM SULFATE 40 GM/1000ML IV SOLN: 2.0000 g/h | INTRAVENOUS | Status: DC

## 2020-11-23 MED ORDER — DIPHENHYDRAMINE HCL 50 MG/ML IJ SOLN: 12.5000 mg | INTRAMUSCULAR | Status: DC | PRN

## 2020-11-23 MED ORDER — CEFAZOLIN SODIUM-DEXTROSE 2-4 GM/100ML-% IV SOLN
2.0000 g | INTRAVENOUS | Status: DC
  Filled 2020-11-23: qty 100

## 2020-11-23 MED ORDER — ONDANSETRON HCL 4 MG/2ML IJ SOLN
4.0000 mg | Freq: Three times a day (TID) | INTRAMUSCULAR | Status: DC | PRN
  Filled 2020-11-23: qty 2

## 2020-11-23 MED ORDER — ACETAMINOPHEN 500 MG PO TABS
1000.0000 mg | ORAL_TABLET | Freq: Four times a day (QID) | ORAL | Status: AC
  Administered 2020-11-24 (×2): 1000 mg via ORAL
  Filled 2020-11-23 (×4): qty 2

## 2020-11-23 MED ORDER — HYDRALAZINE HCL 20 MG/ML IJ SOLN: 10.0000 mg | INTRAMUSCULAR | Status: DC | PRN

## 2020-11-23 MED ORDER — LABETALOL HCL 5 MG/ML IV SOLN: 40.0000 mg | INTRAVENOUS | Status: DC | PRN

## 2020-11-23 MED ORDER — SOD CITRATE-CITRIC ACID 500-334 MG/5ML PO SOLN: 30.0000 mL | Freq: Once | ORAL | Status: DC

## 2020-11-23 MED ORDER — DIPHENHYDRAMINE HCL 25 MG PO CAPS: 25.0000 mg | ORAL_CAPSULE | ORAL | Status: DC | PRN

## 2020-11-23 MED ORDER — LACTATED RINGERS IV BOLUS
1000.0000 mL | Freq: Once | INTRAVENOUS | Status: AC
  Administered 2020-11-23: 1000 mL via INTRAVENOUS

## 2020-11-23 MED ORDER — MAGNESIUM SULFATE BOLUS VIA INFUSION
4.0000 g | Freq: Once | INTRAVENOUS | Status: AC
  Administered 2020-11-23: 4 g via INTRAVENOUS
  Filled 2020-11-23: qty 1000

## 2020-11-23 MED ORDER — NALBUPHINE HCL 10 MG/ML IJ SOLN: 5.0000 mg | Freq: Once | INTRAMUSCULAR | Status: DC | PRN

## 2020-11-23 MED ORDER — NALOXONE HCL 0.4 MG/ML IJ SOLN
0.4000 mg | INTRAMUSCULAR | Status: DC | PRN
Start: 2020-11-23 — End: 2020-11-27

## 2020-11-23 MED ORDER — SODIUM CHLORIDE 0.9% FLUSH: 3.0000 mL | INTRAVENOUS | Status: DC | PRN

## 2020-11-23 MED ORDER — FENTANYL CITRATE (PF) 100 MCG/2ML IJ SOLN
25.0000 ug | INTRAMUSCULAR | Status: DC | PRN
  Administered 2020-11-23: 50 ug via INTRAVENOUS

## 2020-11-23 MED ORDER — ONDANSETRON HCL 4 MG/2ML IJ SOLN
INTRAMUSCULAR | Status: AC
  Filled 2020-11-23: qty 2

## 2020-11-23 MED ORDER — PHENYLEPHRINE HCL-NACL 20-0.9 MG/250ML-% IV SOLN
INTRAVENOUS | Status: AC
  Filled 2020-11-23: qty 250

## 2020-11-23 MED ORDER — PHENYLEPHRINE HCL-NACL 20-0.9 MG/250ML-% IV SOLN
INTRAVENOUS | Status: DC | PRN
  Administered 2020-11-23: 60 ug/min via INTRAVENOUS

## 2020-11-23 MED ORDER — DEXAMETHASONE SODIUM PHOSPHATE 4 MG/ML IJ SOLN
INTRAMUSCULAR | Status: DC | PRN
  Administered 2020-11-23: 4 mg via INTRAVENOUS

## 2020-11-23 MED ORDER — SOD CITRATE-CITRIC ACID 500-334 MG/5ML PO SOLN
30.0000 mL | ORAL | Status: DC
  Filled 2020-11-23: qty 15

## 2020-11-23 MED ORDER — LACTATED RINGERS IV SOLN: INTRAVENOUS | Status: DC

## 2020-11-23 MED ORDER — MORPHINE SULFATE (PF) 0.5 MG/ML IJ SOLN
INTRAMUSCULAR | Status: DC | PRN
  Administered 2020-11-23: .15 mg via INTRATHECAL

## 2020-11-23 MED ORDER — NALOXONE HCL 4 MG/10ML IJ SOLN
1.0000 ug/kg/h | INTRAMUSCULAR | Status: DC | PRN
  Filled 2020-11-23: qty 5

## 2020-11-23 MED ORDER — SCOPOLAMINE 1 MG/3DAYS TD PT72
1.0000 | MEDICATED_PATCH | Freq: Once | TRANSDERMAL | Status: AC
  Administered 2020-11-24: 1.5 mg via TRANSDERMAL
  Filled 2020-11-23: qty 1

## 2020-11-23 MED ORDER — FENTANYL CITRATE (PF) 100 MCG/2ML IJ SOLN
INTRAMUSCULAR | Status: DC | PRN
  Administered 2020-11-23: 15 ug via INTRATHECAL

## 2020-11-23 MED ORDER — DEXAMETHASONE SODIUM PHOSPHATE 4 MG/ML IJ SOLN
INTRAMUSCULAR | Status: AC
  Filled 2020-11-23: qty 1

## 2020-11-23 MED ORDER — SODIUM CHLORIDE 0.9 % IV SOLN: INTRAVENOUS | Status: DC | PRN

## 2020-11-23 MED ORDER — OXYTOCIN-SODIUM CHLORIDE 30-0.9 UT/500ML-% IV SOLN
INTRAVENOUS | Status: DC | PRN
  Administered 2020-11-23: 30 [IU] via INTRAVENOUS

## 2020-11-23 MED ORDER — KETOROLAC TROMETHAMINE 30 MG/ML IJ SOLN
INTRAMUSCULAR | Status: AC
  Filled 2020-11-23: qty 1

## 2020-11-23 MED ORDER — HYDROXYZINE HCL 50 MG PO TABS
25.0000 mg | ORAL_TABLET | Freq: Once | ORAL | Status: DC
  Filled 2020-11-23: qty 1

## 2020-11-23 SURGICAL SUPPLY — 34 items
BENZOIN TINCTURE PRP APPL 2/3 (GAUZE/BANDAGES/DRESSINGS) ×2 IMPLANT
CHLORAPREP W/TINT 26ML (MISCELLANEOUS) ×2 IMPLANT
CLAMP CORD UMBIL (MISCELLANEOUS) IMPLANT
CLOSURE STERI STRIP 1/2 X4 (GAUZE/BANDAGES/DRESSINGS) ×2 IMPLANT
CLOTH BEACON ORANGE TIMEOUT ST (SAFETY) ×2 IMPLANT
DRSG OPSITE POSTOP 4X10 (GAUZE/BANDAGES/DRESSINGS) ×2 IMPLANT
ELECT REM PT RETURN 9FT ADLT (ELECTROSURGICAL) ×2
ELECTRODE REM PT RTRN 9FT ADLT (ELECTROSURGICAL) ×1 IMPLANT
EXTRACTOR VACUUM M CUP 4 TUBE (SUCTIONS) IMPLANT
GLOVE BIOGEL PI IND STRL 6.5 (GLOVE) ×1 IMPLANT
GLOVE BIOGEL PI IND STRL 7.0 (GLOVE) ×1 IMPLANT
GLOVE BIOGEL PI INDICATOR 6.5 (GLOVE) ×1
GLOVE BIOGEL PI INDICATOR 7.0 (GLOVE) ×1
GLOVE ECLIPSE 6.5 STRL STRAW (GLOVE) ×2 IMPLANT
GOWN STRL REUS W/TWL LRG LVL3 (GOWN DISPOSABLE) ×4 IMPLANT
HEMOSTAT ARISTA ABSORB 3G PWDR (HEMOSTASIS) ×2 IMPLANT
KIT ABG SYR 3ML LUER SLIP (SYRINGE) IMPLANT
NEEDLE HYPO 25X5/8 SAFETYGLIDE (NEEDLE) IMPLANT
NS IRRIG 1000ML POUR BTL (IV SOLUTION) ×2 IMPLANT
PACK C SECTION WH (CUSTOM PROCEDURE TRAY) ×2 IMPLANT
PAD ABD 7.5X8 STRL (GAUZE/BANDAGES/DRESSINGS) IMPLANT
PAD ABD DERMACEA PRESS 5X9 (GAUZE/BANDAGES/DRESSINGS) ×2 IMPLANT
PAD OB MATERNITY 4.3X12.25 (PERSONAL CARE ITEMS) ×2 IMPLANT
PENCIL SMOKE EVAC W/HOLSTER (ELECTROSURGICAL) ×2 IMPLANT
RTRCTR C-SECT PINK 25CM LRG (MISCELLANEOUS) ×2 IMPLANT
SUT MON AB 2-0 CT1 27 (SUTURE) ×2 IMPLANT
SUT PDS AB 0 CTX 60 (SUTURE) IMPLANT
SUT PLAIN 2 0 XLH (SUTURE) IMPLANT
SUT VIC AB 0 CTX 36 (SUTURE) ×4
SUT VIC AB 0 CTX36XBRD ANBCTRL (SUTURE) ×4 IMPLANT
SUT VIC AB 4-0 KS 27 (SUTURE) ×2 IMPLANT
TOWEL OR 17X24 6PK STRL BLUE (TOWEL DISPOSABLE) ×2 IMPLANT
TRAY FOLEY W/BAG SLVR 14FR LF (SET/KITS/TRAYS/PACK) ×2 IMPLANT
WATER STERILE IRR 1000ML POUR (IV SOLUTION) ×2 IMPLANT

## 2020-11-23 NOTE — MAU Note (Signed)
Pt sent from MFM, reports good fetal movement but they did not see movement. Pt denies contractions at this time but did have some earlier. ( per report nonreactive NST in MFM).

## 2020-11-23 NOTE — Progress Notes (Signed)
Patient sent to MAU for further evaluation and EFM. MAU notified that patient had BPP 6/8 and NR NST. IUGR and hx covid.

## 2020-11-23 NOTE — Brief Op Note (Signed)
11/23/2020  9:51 PM  PATIENT:  Alison Ellison  30 y.o. female  PRE-OPERATIVE DIAGNOSIS:  Repeat C/S with  Severe Preeclampsia, IUGR, Non-reassuring fetal testing POST-OPERATIVE DIAGNOSIS:  repeat cesarean section with severe pre eclampsia and intra uterine fetal growth restriction, non-reassuring fetal testing  PROCEDURE:  Procedure(s): CESAREAN SECTION (N/A)- low transverse  SURGEON:  Surgeon(s) and Role:    Claiborne Billings, Alaia Lordi, DO - Primary  ANESTHESIA:   spinal   FINDINGS: female infant, cephalic presentation, APGARS 5/8, wt pending, normal tubes and ovaries bilaterally, anterior abdominal wall scarring with indistinct layers.  EBL:  289 mL - per anesthesia, I estimate likely much more than that- 700-800cc  SPECIMEN:  Source of Specimen:  placenta and cord blood  DISPOSITION OF SPECIMEN:  PATHOLOGY  COUNTS:  YES  PLAN OF CARE: Admit to inpatient   PATIENT DISPOSITION:  PACU - hemodynamically stable.   Delay start of Pharmacological VTE agent (>24hrs) due to surgical blood loss or risk of bleeding: not applicable

## 2020-11-23 NOTE — Anesthesia Postprocedure Evaluation (Signed)
Anesthesia Post Note  Patient: Alison Ellison  Procedure(s) Performed: CESAREAN SECTION (N/A )     Patient location during evaluation: PACU Anesthesia Type: Spinal Level of consciousness: oriented and awake and alert Pain management: pain level controlled Vital Signs Assessment: post-procedure vital signs reviewed and stable Respiratory status: spontaneous breathing, respiratory function stable and patient connected to nasal cannula oxygen Cardiovascular status: blood pressure returned to baseline and stable Postop Assessment: no headache, no backache and no apparent nausea or vomiting Anesthetic complications: no   No complications documented.  Last Vitals:  Vitals:   11/23/20 2316 11/23/20 2317  BP:    Pulse: 82 72  Resp: 16 14  Temp:    SpO2: 99% 99%    Last Pain:  Vitals:   11/23/20 2300  TempSrc:   PainSc: 7    Pain Goal: Patients Stated Pain Goal: 1 (11/23/20 2300)                 Babbie Dondlinger L Kyaire Gruenewald

## 2020-11-23 NOTE — Anesthesia Preprocedure Evaluation (Signed)
Anesthesia Evaluation  Patient identified by MRN, date of birth, ID band Patient awake    Reviewed: Allergy & Precautions, NPO status , Patient's Chart, lab work & pertinent test results  Airway Mallampati: II  TM Distance: >3 FB Neck ROM: Full    Dental no notable dental hx.    Pulmonary neg pulmonary ROS,    Pulmonary exam normal breath sounds clear to auscultation       Cardiovascular hypertension (severe preE), Normal cardiovascular exam Rhythm:Regular Rate:Normal     Neuro/Psych  Headaches, negative psych ROS   GI/Hepatic negative GI ROS, Neg liver ROS,   Endo/Other  negative endocrine ROS  Renal/GU negative Renal ROS  negative genitourinary   Musculoskeletal negative musculoskeletal ROS (+)   Abdominal   Peds  Hematology negative hematology ROS (+)   Anesthesia Other Findings Repeat C/S at [redacted] weeks gestation for non reassuring fetal status and maternal severe range BP requiring labetalol x1  Reproductive/Obstetrics (+) Pregnancy                             Anesthesia Physical Anesthesia Plan  ASA: III  Anesthesia Plan: Spinal   Post-op Pain Management:    Induction:   PONV Risk Score and Plan: Treatment may vary due to age or medical condition  Airway Management Planned: Natural Airway  Additional Equipment:   Intra-op Plan:   Post-operative Plan:   Informed Consent: I have reviewed the patients History and Physical, chart, labs and discussed the procedure including the risks, benefits and alternatives for the proposed anesthesia with the patient or authorized representative who has indicated his/her understanding and acceptance.     Dental advisory given  Plan Discussed with: CRNA  Anesthesia Plan Comments:         Anesthesia Quick Evaluation

## 2020-11-23 NOTE — Procedures (Signed)
Alison Ellison 1991-04-07 [redacted]w[redacted]d  Fetus A Non-Stress Test Interpretation for 11/23/20  Indication: IUGR and Unsatisfactory BPP  Fetal Heart Rate A Mode: External Baseline Rate (A): 130 bpm Variability: Minimal Accelerations: None Decelerations: None Multiple birth?: No  Uterine Activity Mode: Palpation,Toco Contraction Frequency (min): 1 1/2-4 Contraction Duration (sec): 20-70 Contraction Quality: Mild Resting Tone Palpated: Relaxed Resting Time: Adequate  Interpretation (Fetal Testing) Nonstress Test Interpretation: Non-reactive (Patient given water, few crackers. FAS was utilized.) Comments: Dr. Judeth Cornfield reviewed tracing. Patient to MAU for further evaluation and EFM

## 2020-11-23 NOTE — Anesthesia Procedure Notes (Signed)
Spinal  Patient location during procedure: OR Start time: 11/23/2020 8:30 PM End time: 11/23/2020 8:40 PM Staffing Performed: anesthesiologist  Anesthesiologist: Elmer Picker, MD Preanesthetic Checklist Completed: patient identified, IV checked, risks and benefits discussed, surgical consent, monitors and equipment checked, pre-op evaluation and timeout performed Spinal Block Patient position: sitting Prep: DuraPrep and site prepped and draped Patient monitoring: cardiac monitor, continuous pulse ox and blood pressure Approach: midline Location: L3-4 Injection technique: single-shot Needle Needle type: Pencan  Needle gauge: 24 G Needle length: 9 cm Assessment Sensory level: T6 Additional Notes Functioning IV was confirmed and monitors were applied. Sterile prep and drape, including hand hygiene and sterile gloves were used. The patient was positioned and the spine was prepped. The skin was anesthetized with lidocaine.  Free flow of clear CSF was obtained prior to injecting local anesthetic into the CSF.  The spinal needle aspirated freely following injection.  The needle was carefully withdrawn.  The patient tolerated the procedure well.

## 2020-11-23 NOTE — MAU Provider Note (Addendum)
Event Date/Time   First Provider Initiated Contact with Patient 11/23/20 1823     Alison Ellison is a 30 y.o. [phone] pregnant female at [redacted]w[redacted]d who was sent to MAU by Dr. Judeth Cornfield at [facility] after a BPP (for FGR) with non-reactive NST (6/10). Pt has a history of HELLP/abruption/stat CS and is very anxious about the prognosis for this pregnancy. Dr. Zannie Kehr recommendation is for further monitoring, if NST reactive she can go home with close follow up, if non-reactive he recommends either overnight observation or delivery.  Patient Vitals for the past 24 hrs:  BP Temp Temp src Pulse Resp SpO2 Height Weight  11/23/20 1946 (!) 153/82 - - 70 - - - -  11/23/20 1941 (!) 158/118 - - 79 - - - -  11/23/20 1916 (!) 177/103 - - 83 - - - -  11/23/20 1901 (!) 181/97 - - 82 - - - -  11/23/20 1846 (!) 144/74 - - 66 - - - -  11/23/20 1813 125/74 - - (!) 108 - - - -  11/23/20 1800 (!) 147/85 98.9 F (37.2 C) Oral 67 15 98 % 4\' 11"  (1.499 m) 181 lb (82.1 kg)   Results for orders placed or performed during the hospital encounter of 11/23/20 (from the past 24 hour(s))  CBC     Status: Abnormal   Collection Time: 11/23/20  6:46 PM  Result Value Ref Range   WBC 8.7 4.0 - 10.5 K/uL   RBC 4.11 3.87 - 5.11 MIL/uL   Hemoglobin 11.9 (L) 12.0 - 15.0 g/dL   HCT 45.4 (L) 09.8 - 11.9 %   MCV 85.4 80.0 - 100.0 fL   MCH 29.0 26.0 - 34.0 pg   MCHC 33.9 30.0 - 36.0 g/dL   RDW 14.7 82.9 - 56.2 %   Platelets 200 150 - 400 K/uL   nRBC 0.0 0.0 - 0.2 %  Comprehensive metabolic panel     Status: Abnormal   Collection Time: 11/23/20  6:46 PM  Result Value Ref Range   Sodium 136 135 - 145 mmol/L   Potassium 3.5 3.5 - 5.1 mmol/L   Chloride 106 98 - 111 mmol/L   CO2 21 (L) 22 - 32 mmol/L   Glucose, Bld 90 70 - 99 mg/dL   BUN 6 6 - 20 mg/dL   Creatinine, Ser 1.30 0.44 - 1.00 mg/dL   Calcium 8.9 8.9 - 86.5 mg/dL   Total Protein 6.7 6.5 - 8.1 g/dL   Albumin 2.9 (L) 3.5 - 5.0 g/dL   AST 18 15 - 41 U/L   ALT 17 0 - 44  U/L   Alkaline Phosphatase 65 38 - 126 U/L   Total Bilirubin 0.4 0.3 - 1.2 mg/dL   GFR, Estimated >78 >46 mL/min   Anion gap 9 5 - 15  Type and screen     Status: None   Collection Time: 11/23/20  6:46 PM  Result Value Ref Range   ABO/RH(D) AB POS    Antibody Screen NEG    Sample Expiration      11/26/2020,2359 Performed at [facility], [address], [place], [place] [address]   Protein / creatinine ratio, urine     Status: Abnormal   Collection Time: 11/23/20  6:46 PM  Result Value Ref Range   Creatinine, Urine 84.64 mg/dL   Total Protein, Urine 29 mg/dL   Protein Creatinine Ratio 0.34 (H) 0.00 - 0.15 mg/mg[Cre]   [place] MFM FETAL BPP W/NONSTRESS  Result  Date: 11/23/2020 ----------------------------------------------------------------------  OBSTETRICS REPORT                       (Signed Final 11/23/2020 05:00 pm) ---------------------------------------------------------------------- Patient Info  ID #:       [id number]                          D.O.B.:  [birth year] (29 yrs)  Name:       Alison Ellison                 Visit Date: 11/23/2020 03:46 pm ---------------------------------------------------------------------- Performed By  Attending:        Noralee Space MD        Ref. Address:     [facility]                                                             OBGYN                                                             [address]                                                             [place]  Performed By:     Ceasar Lund        Location[facility]         [facility]                                                             [facility]  Referred By:      Rande BruntOSALEA K TAAM-                    AKELMAN  ---------------------------------------------------------------------- Orders  #  Description                           Code        Ordered By  1  US MFM UA CORD DOPPLER                76820.02    YU FANG  2  US MFM FETAL BPP                      [id number]     Rosana HoesYU FANG     W/NONSTRESS ----------------------------------------------------------------------  #  Order #                     Accession #                Episode #  1  [id number]                   [id number]                 [id number]  2  [id number]                   [id number]                 [id number] ---------------------------------------------------------------------- Indications  Maternal care for known or suspected poor      O36.5931  fetal growth, third trimester, fetus 1 IUGR  Obesity complicating pregnancy, third          O99.213  trimester (BMI 32)  Encounter for antenatal screening for          Z36.3  malformations  Covid + 08/2020  [redacted] weeks gestation of pregnancy                Z3A.34 ---------------------------------------------------------------------- Fetal Evaluation  Num Of Fetuses:         1  Fetal Heart Rate(bpm):  138  Cardiac Activity:       Observed  Presentation:           Cephalic  Placenta:               Fundal  P. Cord Insertion:      Previously Visualized  Amniotic Fluid  AFI FV:      Within normal limits  AFI Sum(cm)     %Tile       Largest Pocket(cm)  16.38           59          5.27  RUQ(cm)       RLQ(cm)       LUQ(cm)        LLQ(cm)  4.4           3.6           3.11           5.27 ---------------------------------------------------------------------- Biophysical Evaluation  Amniotic F.V:   Within normal limits       F. Tone:        Observed  F. Movement:    Observed                   N.S.T:  Nonreactive  F. Breathing:   Not Observed               Score:          6/10 ---------------------------------------------------------------------- Biometry  LV:        2.4  mm  ---------------------------------------------------------------------- OB History  Gravidity:    3         Term:   1  TOP:          1        Living:  1 ---------------------------------------------------------------------- Gestational Age  LMP:           34w 0d        Date:  03/30/20                 EDD:   01/04/21  Best:          34w 0d     Det. By:  LMP  (03/30/20)          EDD:   01/04/21 ---------------------------------------------------------------------- Anatomy  Ventricles:            Appears normal         Stomach:                Appears normal, left                                                                        sided  Heart:                 Seen                   Kidneys:                Appear normal  Ductal Arch:           Seen                   Bladder:                Appears normal  Diaphragm:             Appears normal  Other:  All other anatomy previously seen as normal. ---------------------------------------------------------------------- Doppler - Fetal Vessels  Umbilical Artery   S/D     %tile      RI    %tile                             ADFV    RDFV   3.25       85    0.69       87                                No      No ---------------------------------------------------------------------- Impression  Patient with fetal growth restriction return for antenatal  testing.  On ultrasound performed 2 weeks ago, the  estimated fetal weight was at the 3rd percentile.  On today's ultrasound, amniotic fluid is normal and good fetal  activity seen.  Umbilical artery Doppler showed normal  forward diastolic flow.  Fetal breathing movements did not  meet the criteria of BPP.  NST is not reactive.  BPP 6/10.  I explained the findings and recommended extended  monitoring at the MAU.  Discussed with MAU team. ---------------------------------------------------------------------- Recommendations  -Extended monitoring at the MAU.  -If NST is reactive, the patient may be discharged.  --If NST is not  reactive, patient may be kept for observation  overnight and BPP repeated tomorrow morning.  -Fetal growth, BPP and UA Doppler next week.  -We will make decision on the timing of delivery after next  fetal growth assessment ----------------------------------------------------------------------                  Noralee Space, MD Electronically Signed Final Report   11/23/2020 05:00 pm ----------------------------------------------------------------------  [place] MFM UA CORD DOPPLER  Result Date: 11/23/2020 ----------------------------------------------------------------------  OBSTETRICS REPORT                       (Signed Final 11/23/2020 05:00 pm) ---------------------------------------------------------------------- Patient Info  ID #:       [id number]                          D.O.B.:  1991/10/06 (29 yrs)  Name:       Alison Ellison                 Visit Date: 11/23/2020 03:46 pm ---------------------------------------------------------------------- Performed By  Attending:        Noralee Space MD        Ref. Address:     [facility]                                                             OBGYN                                                             [address]                                                             [place]  Performed By:     Ceasar Lund        Location[facility]         [facility]  Referred By:      Rande Brunt ---------------------------------------------------------------------- Orders  #  Description                           Code        Ordered By  1  [place] MFM UA CORD DOPPLER                [id number]    YU FANG  2  [place] MFM FETAL BPP                      62863.8      Rosana Hoes     W/NONSTRESS ----------------------------------------------------------------------  #  Order #                     Accession #                Episode #  1  [id number]                   [id number]                 [id number]  2  [id number]                   [id number]                 [id number] ---------------------------------------------------------------------- Indications  Maternal care for known or suspected poor      O36.5931  fetal growth, third trimester, fetus 1 IUGR  Obesity complicating pregnancy, third          O99.213  trimester (BMI 32)  Encounter for antenatal screening for          Z36.3  malformations  Covid + 08/2020  [redacted] weeks gestation of pregnancy                Z3A.34 ---------------------------------------------------------------------- Fetal Evaluation  Num Of Fetuses:         1  Fetal Heart Rate(bpm):  138  Cardiac Activity:       Observed  Presentation:           Cephalic  Placenta:               Fundal  P. Cord Insertion:      Previously Visualized  Amniotic Fluid  AFI FV:      Within normal limits  AFI Sum(cm)     %Tile       Largest Pocket(cm)  16.38           59  5.27  RUQ(cm)       RLQ(cm)       LUQ(cm)        LLQ(cm)  4.4           3.6           3.11           5.27 ---------------------------------------------------------------------- Biophysical Evaluation  Amniotic F.V:   Within normal limits       F. Tone:        Observed  F. Movement:    Observed                   N.S.T:          Nonreactive  F. Breathing:   Not Observed               Score:          6/10 ---------------------------------------------------------------------- Biometry  LV:        2.4  mm ---------------------------------------------------------------------- OB History  Gravidity:    3         Term:   1  TOP:          1        Living:  1 ---------------------------------------------------------------------- Gestational Age  LMP:           34w 0d        Date:  03/30/20                 EDD:   01/04/21   Best:          34w 0d     Det. By:  LMP  (03/30/20)          EDD:   01/04/21 ---------------------------------------------------------------------- Anatomy  Ventricles:            Appears normal         Stomach:                Appears normal, left                                                                        sided  Heart:                 Seen                   Kidneys:                Appear normal  Ductal Arch:           Seen                   Bladder:                Appears normal  Diaphragm:             Appears normal  Other:  All other anatomy previously seen as normal. ---------------------------------------------------------------------- Doppler - Fetal Vessels  Umbilical Artery   S/D     %tile      RI    %tile                             ADFV  RDFV   3.25       85    0.69       87                                No      No ---------------------------------------------------------------------- Impression  Patient with fetal growth restriction return for antenatal  testing.  On ultrasound performed 2 weeks ago, the  estimated fetal weight was at the 3rd percentile.  On today's ultrasound, amniotic fluid is normal and good fetal  activity seen.  Umbilical artery Doppler showed normal  forward diastolic flow.  Fetal breathing movements did not  meet the criteria of BPP.  NST is not reactive.  BPP 6/10.  I explained the findings and recommended extended  monitoring at the MAU.  Discussed with MAU team. ---------------------------------------------------------------------- Recommendations  -Extended monitoring at the MAU.  -If NST is reactive, the patient may be discharged.  --If NST is not reactive, patient may be kept for observation  overnight and BPP repeated tomorrow morning.  -Fetal growth, BPP and UA Doppler next week.  -We will make decision on the timing of delivery after next  fetal growth assessment ----------------------------------------------------------------------                  Noralee Space, MD Electronically Signed Final Report   11/23/2020 05:00 pm ----------------------------------------------------------------------  Physical Exam Vitals and nursing note reviewed.  Constitutional:      General: She is not in acute distress.    Appearance: Normal appearance. She is not ill-appearing.  HENT:     Mouth/Throat:     Mouth: Mucous membranes are moist.  Eyes:     Pupils: Pupils are equal, round, and reactive to light.  Cardiovascular:     Rate and Rhythm: Normal rate.  Pulmonary:     Effort: Pulmonary effort is normal.  Musculoskeletal:        General: Normal range of motion.  Skin:    General: Skin is warm and dry.     Capillary Refill: Capillary refill takes less than 2 seconds.  Neurological:     Mental Status: She is alert and oriented to person, place, and time.  Psychiatric:        Mood and Affect: Mood normal.        Behavior: Behavior normal.        Thought Content: Thought content normal.        Judgment: Judgment normal.     Comments: Pt very anxious   Fetal Tracing: non-reactive Baseline: 130 Variability: minimal to none Accelerations: none Decelerations: occasional variable Toco: irregular q2-71min  LR bolus ordered, pt given juice to try and stimulate fetal reactivity. Admission labs drawn. NST remained non-reactive.  Two severe range pressures, labetalol protocol put in. Pt becoming more anxious as she waited for NST, Vistaril ordered/given  Pt meets criteria for preeclampsia based on FGR + severe range BP, preeclampsia labs ordered as well  Faculty attending (Dr. Alysia Penna) made aware, agreed with at least overnight observation (informed prior to severe range pressure)  Gave report to [facility] on-call MD (Dr. Claiborne Billings) who initially agreed to observation but then pt had severe range BP, so she consulted Dr. Judeth Cornfield and decided to take pt for repeat CS tonight.  Care officially turned over to Dr. Claiborne Billings at [phone].  Edd Arbour, CNM,  MSN, IBCLC Certified Nurse Midwife, [facility]

## 2020-11-23 NOTE — Transfer of Care (Signed)
Immediate Anesthesia Transfer of Care Note  Patient: Alison Ellison  Procedure(s) Performed: CESAREAN SECTION (N/A )  Patient Location: PACU  Anesthesia Type:Spinal  Level of Consciousness: awake, alert  and oriented  Airway & Oxygen Therapy: Patient Spontanous Breathing  Post-op Assessment: Report given to RN and Post -op Vital signs reviewed and stable  Post vital signs: Reviewed and stable  Last Vitals:  Vitals Value Taken Time  BP 129/69 11/23/20 2203  Temp    Pulse 65 11/23/20 2207  Resp 17 11/23/20 2207  SpO2 100 % 11/23/20 2207  Vitals shown include unvalidated device data.  Last Pain:  Vitals:   11/23/20 1800  TempSrc: Oral         Complications: No complications documented.

## 2020-11-23 NOTE — H&P (Signed)
30 y.o. [redacted]w[redacted]d  [id number] comes in from MFM office for additional monitoring.  While in MFM office at approx 3pm pt had a mild range blood pressure.  Since in MAU she has had several mild range BPs and recently 2 severe range BPs requiring IV labetalol.  She has been being followed by MFM for IUGR.  Today's testing showed NR-NST and BPP 6/8.  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  She required 4U PRBC transfusion.  She was diagnosed with covid this pregnancy 09/12/2020.  Otherwise has good fetal movement and no bleeding.  Past Medical History:  Diagnosis Date  . Headache   . Preterm labor     Sickle cell trait  Past Surgical History:  Procedure Laterality Date  . CESAREAN SECTION      OB History  Gravida Para Term Preterm AB Living  3 1   1 1 1   SAB IAB Ectopic Multiple Live Births    1     1    # Outcome Date GA Lbr Len/2nd Weight Sex Delivery Anes PTL Lv  3 Current           2 IAB           1 Preterm  [redacted]w[redacted]d    CS-LTranv       Social History   Socioeconomic History  . Marital status: Single    Spouse name: Not on file  . Number of children: Not on file  . Years of education: Not on file  . Highest education level: Not on file  Occupational History  . Not on file  Tobacco Use  . Smoking status: Never Smoker  . Smokeless tobacco: Never Used  Vaping Use  . Vaping Use: Never used  Substance and Sexual Activity  . Alcohol use: No  . Drug use: No  . Sexual activity: Yes    Birth control/protection: I.U.D.  Other Topics Concern  . Not on file  Social History Narrative  . Not on file   Social Determinants of Health   Financial Resource Strain: Not on file  Food Insecurity: Not on file  Transportation Needs: Not on file  Physical Activity: Not on file  Stress: Not on file  Social Connections: Not on file  Intimate Partner Violence: Not on file   Patient has no known allergies.     Prenatal Transfer Tool  Maternal Diabetes: No Genetic Screening: Normal Maternal Ultrasounds/Referrals: Normal Fetal Ultrasounds or other Referrals:  Referred to Materal Fetal Medicine  for growth scan at 32 weeks d/t covid: EFW 1502g 3.4%, AC 6% and ongoing ANT. Maternal Substance Abuse:  No Significant Maternal Medications:  None Significant Maternal Lab Results: Group B Strep positive  Other complications of pregnancy: covid, IUGR, h.o HELLP and placenta abruption prior pregnancy, new onset severe range BPs today    Vitals:   11/23/20 1813 11/23/20 1846 11/23/20 1901 11/23/20 1916  BP: 125/74 (!) 144/74 (!) 181/97 (!) 177/103  Pulse: (!) 108 66 82 83  Resp:      Temp:      TempSrc:      SpO2:      Weight:      Height:        Lungs/Cor:  NAD Abdomen:  soft, gravid Ex:  no cords, erythema SVE:  deferred FHTs:  130, min var, no accels, occ variabile Toco:  q 2-6   A/P   Admit  for repeat C/S for IUGR, severe preeclampsia, non-reassuring fetal testing S/p IV labetalol in MAU for severe BPs, ALT/AST/Plt wnl Discussed case with Dr. Judeth Cornfield, MFM who recommends proceeding with delivery now. He does not recommend waiting for completion of betamethsone.  One dose beta received in MAU. Discussed with patient who agrees to proceed with repeat c/s. Risks,benefits/alternatives discussed with patient and consent obtained. Will plan to start Mg SO4 after C/S.  GBS Pos  Philip Aspen

## 2020-11-24 ENCOUNTER — Encounter (HOSPITAL_COMMUNITY): Payer: Self-pay | Admitting: Obstetrics and Gynecology

## 2020-11-24 LAB — CBC
HCT: 30.8 % — ABNORMAL LOW (ref 36.0–46.0)
Hemoglobin: 10.3 g/dL — ABNORMAL LOW (ref 12.0–15.0)
MCH: 28.8 pg (ref 26.0–34.0)
MCHC: 33.4 g/dL (ref 30.0–36.0)
MCV: 86 fL (ref 80.0–100.0)
Platelets: 182 10*3/uL (ref 150–400)
RBC: 3.58 MIL/uL — ABNORMAL LOW (ref 3.87–5.11)
RDW: 14.9 % (ref 11.5–15.5)
WBC: 14.2 10*3/uL — ABNORMAL HIGH (ref 4.0–10.5)
nRBC: 0 % (ref 0.0–0.2)

## 2020-11-24 LAB — COMPREHENSIVE METABOLIC PANEL
ALT: 19 U/L (ref 0–44)
AST: 30 U/L (ref 15–41)
Albumin: 2.5 g/dL — ABNORMAL LOW (ref 3.5–5.0)
Alkaline Phosphatase: 61 U/L (ref 38–126)
Anion gap: 11 (ref 5–15)
BUN: 5 mg/dL — ABNORMAL LOW (ref 6–20)
CO2: 19 mmol/L — ABNORMAL LOW (ref 22–32)
Calcium: 8 mg/dL — ABNORMAL LOW (ref 8.9–10.3)
Chloride: 107 mmol/L (ref 98–111)
Creatinine, Ser: 0.8 mg/dL (ref 0.44–1.00)
GFR, Estimated: >60 mL/min (ref >60)
Glucose, Bld: 113 mg/dL — ABNORMAL HIGH (ref 70–99)
Potassium: 5.3 mmol/L — ABNORMAL HIGH (ref 3.5–5.1)
Sodium: 137 mmol/L (ref 135–145)
Total Bilirubin: 0.8 mg/dL (ref 0.3–1.2)
Total Protein: 5.4 g/dL — ABNORMAL LOW (ref 6.5–8.1)

## 2020-11-24 LAB — RPR: RPR Ser Ql: NONREACTIVE

## 2020-11-24 MED ORDER — LACTATED RINGERS IV SOLN: INTRAVENOUS | Status: DC

## 2020-11-24 MED ORDER — DIPHENHYDRAMINE HCL 25 MG PO CAPS: 25.0000 mg | ORAL_CAPSULE | Freq: Four times a day (QID) | ORAL | Status: DC | PRN

## 2020-11-24 MED ORDER — OXYTOCIN-SODIUM CHLORIDE 30-0.9 UT/500ML-% IV SOLN: 2.5000 [IU]/h | INTRAVENOUS | Status: DC

## 2020-11-24 MED ORDER — COCONUT OIL OIL: 1.0000 "application " | TOPICAL_OIL | Status: DC | PRN

## 2020-11-24 MED ORDER — ACETAMINOPHEN 325 MG PO TABS: 650.0000 mg | ORAL_TABLET | ORAL | Status: DC | PRN

## 2020-11-24 MED ORDER — SIMETHICONE 80 MG PO CHEW: 80.0000 mg | CHEWABLE_TABLET | ORAL | Status: DC | PRN

## 2020-11-24 MED ORDER — SENNOSIDES-DOCUSATE SODIUM 8.6-50 MG PO TABS
2.0000 | ORAL_TABLET | Freq: Every day | ORAL | Status: DC
  Administered 2020-11-24 – 2020-11-27 (×4): 2 via ORAL
  Filled 2020-11-24 (×4): qty 2

## 2020-11-24 MED ORDER — TETANUS-DIPHTH-ACELL PERTUSSIS 5-2.5-18.5 LF-MCG/0.5 IM SUSY: 0.5000 mL | PREFILLED_SYRINGE | Freq: Once | INTRAMUSCULAR | Status: DC

## 2020-11-24 MED ORDER — MENTHOL 3 MG MT LOZG: 1.0000 | LOZENGE | OROMUCOSAL | Status: DC | PRN

## 2020-11-24 MED ORDER — PRENATAL MULTIVITAMIN CH
1.0000 | ORAL_TABLET | Freq: Every day | ORAL | Status: DC
  Administered 2020-11-24 – 2020-11-27 (×4): 1 via ORAL
  Filled 2020-11-24 (×4): qty 1

## 2020-11-24 MED ORDER — DIBUCAINE (PERIANAL) 1 % EX OINT: 1.0000 "application " | TOPICAL_OINTMENT | CUTANEOUS | Status: DC | PRN

## 2020-11-24 MED ORDER — IBUPROFEN 800 MG PO TABS
800.0000 mg | ORAL_TABLET | Freq: Three times a day (TID) | ORAL | Status: AC
  Administered 2020-11-24 – 2020-11-26 (×7): 800 mg via ORAL
  Filled 2020-11-24 (×8): qty 1

## 2020-11-24 MED ORDER — MAGNESIUM SULFATE 40 GM/1000ML IV SOLN: 2.0000 g/h | INTRAVENOUS | Status: DC

## 2020-11-24 MED ORDER — OXYCODONE HCL 5 MG PO TABS
5.0000 mg | ORAL_TABLET | ORAL | Status: DC | PRN
  Administered 2020-11-25 (×2): 5 mg via ORAL
  Administered 2020-11-26 – 2020-11-27 (×2): 10 mg via ORAL
  Filled 2020-11-24 (×2): qty 1
  Filled 2020-11-24 (×2): qty 2

## 2020-11-24 MED ORDER — MAGNESIUM SULFATE 40 GM/1000ML IV SOLN
2.0000 g/h | INTRAVENOUS | Status: AC
  Administered 2020-11-24 (×2): 2 g/h via INTRAVENOUS
  Filled 2020-11-24: qty 1000

## 2020-11-24 MED ORDER — WITCH HAZEL-GLYCERIN EX PADS: 1.0000 "application " | MEDICATED_PAD | CUTANEOUS | Status: DC | PRN

## 2020-11-24 MED ORDER — SIMETHICONE 80 MG PO CHEW
80.0000 mg | CHEWABLE_TABLET | Freq: Three times a day (TID) | ORAL | Status: DC
  Administered 2020-11-24 – 2020-11-27 (×8): 80 mg via ORAL
  Filled 2020-11-24 (×9): qty 1

## 2020-11-24 MED ORDER — ZOLPIDEM TARTRATE 5 MG PO TABS: 5.0000 mg | ORAL_TABLET | Freq: Every evening | ORAL | Status: DC | PRN

## 2020-11-24 NOTE — Op Note (Signed)
Alison Ellison, Alison Ellison MEDICAL RECORD YC:[id number] ACCOUNT [id number] DATE OF BIRTH:[birth year] FACILITY: [facility] LOCATION: [facility] PHYSICIAN:Rhealynn Myhre M. Claiborne Billings, DO  OPERATIVE REPORT  DATE OF PROCEDURE:  11/23/2020  PREOPERATIVE DIAGNOSIS:  Repeat cesarean section in setting of term pregnancy with severe preeclampsia, intrauterine growth restriction and nonreassuring fetal testing.  POSTOPERATIVE DIAGNOSIS:  Repeat cesarean section in setting of term pregnancy with severe preeclampsia, intrauterine growth restriction and nonreassuring fetal testing.  PROCEDURE:  Low transverse cesarean section.  SURGEON:  Philip Aspen, DO  ANESTHESIA:  Spinal.  FINDINGS:  Female infant, cephalic presentation, Apgars 5 and 8, weight pending.  Normal tubes and ovaries bilaterally.  Anterior abdominal wall scarring with indistinct layers.  ESTIMATED BLOOD LOSS:  Per anesthesia 289 mL documented.  I believe this is underestimate, but bleeding still in normal range at probably 700-800 mL.  SPECIMENS:  Placenta and cord blood.  COMPLICATIONS:  None.  CONDITION:  Stable to PACU.  DESCRIPTION OF PROCEDURE:  The patient was taken to the operating room where spinal anesthesia was administered and found to be adequate.  She was prepped and draped in the normal sterile fashion in dorsal supine position with a leftward tilt.  A  Pfannenstiel skin incision was made with the scalpel and carried down to underlying layer of fascia with Bovie cautery.  The fascia was incised with the scalpel and extended laterally with Mayo scissors.  Kocher clamps were placed at the superior aspect  of the fascial incision.  Rectus muscles were dissected off bluntly and sharply.  Kocher clamps were then placed at the inferior aspect of the fascial incision.  Rectus muscles were dissected off bluntly and sharply.  The hemostat was used to separate  rectus muscle superiorly and with good visualization the peritoneum was entered bluntly.   The peritoneal incision was extended by manual lateral traction.  Abdomen and pelvis were manually surveyed and an Pharmacologist was placed.  Bladder was  low, some minimal scar tissue.  The vesicouterine peritoneum was tented, entered sharply and a bladder flap was developed digitally.  A scalpel was then used to make a low transverse cesarean incision and the amniotic sac was entered bluntly with an  Allis clamp.  The hysterotomy was extended by cephalic and caudal traction and the infant's head was elevated and delivered without difficulty followed by the remainder of the infant's body.  Delayed cord clamping was performed at the instruction of the  awaiting neonatologist, at 45 seconds the cord was clamped and cut and the infant was handed off to awaiting neonatology.  During this time, infant was bulb suctioned and dried with a towel.  Cord blood was then collected and external massage of the  uterus facilitated contraction.  The umbilical cord was placed on gentle traction and the placenta was delivered without difficulty.  The uterus was cleared of all clot and debris and the uterine incision was reapproximated and closed with Vicryl in a  running locked fashion followed by second layer of vertical imbrication serosal superficial bleeding was noted at site of needle entry along the incision.  Compression was placed while the tubes and ovaries were examined.  Two additional figure-of-eight  sutures were placed in the midline and additional compression was performed, at that point minimal bleeding was noted.  Arista was placed over this area prophylactically however, the peritoneum was too taut to be able to reapproximate, all muscular  surfaces and prior scar tissue was examined and found to be hemostatic.  Fascia was reapproximated and closed  with looped PDS in a running fashion.  Subcutaneous tissue was irrigated, dried and minimal use of Bovie cautery was needed to create  hemostasis.  The  skin was then reapproximated and closed with Vicryl on a Keith needle.  Minimal bleeding was noted; however, prophylactically due to minimal oozing compression dressing was placed.  The patient tolerated the procedure well.  Sponge, lap  and needle counts were correct x2.  The patient was taken to recovery in stable condition.  HN/NUANCE  D:11/24/2020 T:11/24/2020 JOB:[id number]

## 2020-11-24 NOTE — Lactation Note (Signed)
This note was copied from a baby's chart. Lactation Consultation Note  Patient Name: Alison Ellison AXENM'M Date: 11/24/2020   Age:30 hours, LPTI in NICU LC entered room, Mom was not  feeling well, RN was doing mom's assessment. Per RN, mom prefers [facility] consult in morning due to  tiredness and vomiting. LC gave RN DEBP kit and pump, mom plans to Use DEBP  in morning.   Maternal Data    Feeding    LATCH Score                    Lactation Tools Discussed/Used    Interventions    Discharge    Consult Status      Alison Ellison 11/24/2020, 1:02 AM

## 2020-11-24 NOTE — Progress Notes (Signed)
Patient has not yet eaten, has tolerated liquids, foley in, has not been ambulating, SCDs in place, denies calf pain.  Pain control is good.  Appropriate lochia, no complaints.  Reports baby doing well in NICU.  Vitals:   11/24/20 0320 11/24/20 0415 11/24/20 0505 11/24/20 0615  BP:  118/78 113/71 119/77  Pulse:  84 77 79  Resp:  19 20 20   Temp:    97.7 F (36.5 C)  TempSrc:    Oral  SpO2: 99% 99% 97% 99%  Weight:      Height:        Fundus firm Abd: nontender Inc: c/d/i Ext: no calf tenderness  Lab Results  Component Value Date   WBC 14.2 (H) 11/24/2020   HGB 10.3 (L) 11/24/2020   HCT 30.8 (L) 11/24/2020   MCV 86.0 11/24/2020   PLT 182 11/24/2020    --/--/AB POS (02/04 1846)  A/P Post op day #1 s/p repeat c/s for IUGR, severe PEC BPs in normal range without further medication, will continue MgSO4 and discontinue at 24 hrs postop Repeat ALT/AST/PLT wnl Baby in NICU, planning circ when ready, discussed risks/benefits/alternatives, consent obtained.  Routine care.   03-19-2001

## 2020-11-24 NOTE — Lactation Note (Signed)
This note was copied from a baby's chart. Lactation Consultation Note  Patient Name: Alison Ellison HOZYY'Q Date: 11/24/2020 Reason for consult: Initial assessment;Late-preterm 34-36.6wks;NICU baby;Infant < 6lbs;1st time breastfeeding Age:30 hours  Ms. Purdie is a P2 mother with no breastfeeding or pumping history. She informed that she would like to provided her breastmilk to 106 hour old baby Alison, Cristal Deer. She informed that she had to have a c/s due to preE. She is awaiting a pump from her insurance to use at home.  Ms. Gallina was set up with a DEBP using 24 flange size. Drops were expressed from the L breast and stored in a colostrum bullet. She was educated on DEBP set up, cleaning, and frequency. Milk storage guidelines were reviewed. We reviewed the importance of colostrum and breast stimulation. She was informed of lactation services. She has no current questions or concerns at this time. She was provided NICU booklet and community resources brochure.   Plan: - She will pump every 3 hours for 15 minutes or 8x or more in a 24 hours period.  - Send/take any expressed breast milk to NICU   Maternal Data Does the patient have breastfeeding experience prior to this delivery?: No  Feeding Mother's Current Feeding Choice: Breast Milk and Donor Milk  Interventions Interventions: DEBP;Education  Discharge    Consult Status Consult Status: Follow-up Date: 11/25/20 Follow-up type: In-patient    Zola Button 11/24/2020, 9:54 AM

## 2020-11-24 NOTE — Progress Notes (Signed)
Magnesium sulfate medication discontinued in error in [facility]. Patient continuously received medication starting @2308  per Dr. order. Medication reordered in [facility] after error discovered. Patient did not stop receiving medication 2g/hr from 0010-0515.

## 2020-11-25 NOTE — Progress Notes (Signed)
Patient is eating, ambulating, voiding.  Pain control is good.  Appropriate lochia, no complaints.   Vitals:   11/24/20 2005 11/25/20 0059 11/25/20 0517 11/25/20 0845  BP: (!) 146/90 105/64 125/73 124/78  Pulse: 88 86 75 87  Resp: 19 20 19 16   Temp: 98.3 F (36.8 C) 98.7 F (37.1 C) 98.5 F (36.9 C) 98.8 F (37.1 C)  TempSrc: Oral Oral Oral Oral  SpO2: 99% 100% 100% 100%  Weight:      Height:        Fundus firm Inc: c/d/i, abd appropriately tender Ext: no calf tenderness  Lab Results  Component Value Date   WBC 14.2 (H) 11/24/2020   HGB 10.3 (L) 11/24/2020   HCT 30.8 (L) 11/24/2020   MCV 86.0 11/24/2020   PLT 182 11/24/2020    --/--/AB POS (02/04 1846)  A/P Post op day # 2 s/p Repeat C/S, IUGR, severe PEC S/p MgSO4, BPs mainly normal range without medication Baby in NICU requiring care for lung immaturity.  Routine care.  03-19-2001

## 2020-11-25 NOTE — Lactation Note (Signed)
This note was copied from a baby's chart. Lactation Consultation Note  Patient Name: Alison Ellison Date: 11/25/2020 Reason for consult: Follow-up assessment;NICU baby;Late-preterm 34-36.6wks;Infant < 6lbs Age:30 hours   LC in to visit with P2 Mom of infant in the NICU.   Mom feeling much better today, eating breakfast currently.  Mom hasn't been consistently pumping yet, but plans to today.  Reviewed basics and encouraged breast massage and hand expression into colostrum containers. Mom has NICU breast milk labels for her EBM.  Mom waiting on a pump from her insurance company.  Mom knows about the pump rentals in the gift shop available to her.  Mom also aware of DEBP in baby's room in the NICU.   Encouraged pumping 8 or more times per 24 hrs when she is awake.   Lactation Tools Discussed/Used Tools: Pump Breast pump type: Double-Electric Breast Pump  Interventions Interventions: Breast feeding basics reviewed;Education;Breast massage;Hand express;DEBP  Discharge Pump: Refer for rental [facility] Program: No  Consult Status Consult Status: Follow-up Date: 11/26/20 Follow-up type: In-patient    Alison Ellison 11/25/2020, 10:15 AM

## 2020-11-26 MED ORDER — NIFEDIPINE ER OSMOTIC RELEASE 30 MG PO TB24
60.0000 mg | ORAL_TABLET | Freq: Every day | ORAL | Status: DC
  Administered 2020-11-27: 60 mg via ORAL
  Filled 2020-11-26: qty 2

## 2020-11-26 MED ORDER — NIFEDIPINE ER OSMOTIC RELEASE 30 MG PO TB24
30.0000 mg | ORAL_TABLET | Freq: Every day | ORAL | Status: DC
  Administered 2020-11-26: 30 mg via ORAL
  Filled 2020-11-26: qty 1

## 2020-11-26 MED ORDER — NIFEDIPINE ER OSMOTIC RELEASE 30 MG PO TB24
30.0000 mg | ORAL_TABLET | Freq: Once | ORAL | Status: AC
  Administered 2020-11-26: 30 mg via ORAL
  Filled 2020-11-26: qty 1

## 2020-11-26 NOTE — Lactation Note (Signed)
This note was copied from a baby's chart. Lactation Consultation Note  Patient Name: Alison Ellison [id number] Date: 11/26/2020 Reason for consult: Engorgement Age:30 hours  Follow up with P2 mother of a 43 hours old infant currently in NICU, per mother's request. Mother is severely engorged. Mother states she is pumping and able to get milk out of left breast but not from the right. LC noted firm, warm, engorged breasts. Mother explains they are very uncomfortable.  Ice bags to breast for 15 minutes and did a lymphatic massage with reverse pressure using coconut oil. Per mother, breasts/nipples feel better. LC reviewed how to use a DEBP and checked flange size. Mother fitted for 27 mm to left nipple and 24 mm to right. Observed right nipple is flat and flange is easily misplaced. Once that was identified, mother was able to collect 210 mL of EBM combined.   LC discussed with mother pumping following maintenance setting. Explained initial drops, then spraying (letdown), followed by drops again. Pumped for two more minutes when noticing drops after letdown. Discussed that pumping session time will always vary and it will give her stimulation similar to infant feeding from breast. Mother verbalizes in agreement.   Encouraged to request [facility] for any needs, support or questions. Praised mother for her milk supply, effort and dedication.   Plan: 1-Apply ice to breast for 15 minutes at a time at least 30 minutes prior to pumping.  2-Massage breasts using coconut oil prior to pumping. 3-Pump 8-12 in 24 h following maintenance setting. 4-Only use heat while actively pumping.   All questions answered at this time.   Maternal Data Does the patient have breastfeeding experience prior to this delivery?: No  Feeding Mother's Current Feeding Choice: Breast Milk and Donor Milk  Lactation Tools Discussed/Used Tools: Pump;Flanges;Coconut oil Flange Size: 24;27 Breast pump type: Double-Electric Breast  Pump Pump Education: Setup, frequency, and cleaning;Milk Storage Reason for Pumping: maternal infant separation Pumping frequency: 8-12 x 24h Pumped volume: 210 mL  Interventions Interventions: Expressed milk;DEBP;Reverse pressure;Coconut oil  Discharge Discharge Education: Engorgement and breast care  Consult Status Consult Status: Follow-up Date: 11/27/20 Follow-up type: In-patient    Barb Shear A Higuera Ancidey 11/26/2020, 6:37 PM

## 2020-11-26 NOTE — Progress Notes (Signed)
Patient screened out for psychosocial assessment since none of the following apply:  Psychosocial stressors documented in mother or baby's chart  Gestation less than 32 weeks  Code at delivery   Infant with anomalies Please contact the Clinical Social Worker if specific needs arise, by MOB's request, or if MOB scores greater than 9/yes to question 10 on Edinburgh Postpartum Depression Screen.  Rambo Sarafian, LCSW Clinical Social Worker [facility] Cell#: [phone]     

## 2020-11-26 NOTE — Lactation Note (Addendum)
This note was copied from a baby's chart. Lactation Consultation Note  Patient Name: Alison Ellison [id number] Date: 11/26/2020 Reason for consult: Follow-up assessment;Engorgement;NICU baby;1st time breastfeeding Age:30 hours  P2, Baby 62 hours old and mother's breasts are becoming engorged due to filling. Mother pumped prior to [facility] coming in room.  She states she pumped drops.   Will have lactation follow up with mother later today PRN.  Emphasized pumping q 3 hours for 15-20 min.  Helped mother with reverse massage while lying flat and applied ice packs.  Continue ice q 3 hrs 10-15 min. top and bottom.  Encouraged mother to hand express before and after pumping as tolerated. Suggest mother apply warm moist compresses before pumping and use breast massage while pumping. Reviewed plan with Alison Hancock RN.    Lactation Tools Discussed/Used Tools: Pump Breast pump type: Double-Electric Breast Pump Reason for Pumping: NICU Pumping frequency:  (Recommend q 3 hours)  Interventions Interventions: Hand express;Ice;DEBP;Education  Discharge Discharge Education: Engorgement and breast care  Mother will call insurance company to check on DEBP.   Consult Status Consult Status: Follow-up Date: 11/27/20 Follow-up type: In-patient    Dahlia Byes [facility] 11/26/2020, 11:13 AM

## 2020-11-26 NOTE — Progress Notes (Signed)
Pt's breasts noted to be very engorged and painful. Encouraged pt to pump every 3 hours and apply ice for 15-20 minutes following pumping. Pt verbalized understanding. Lactation made aware and will see pt during rounding.

## 2020-11-26 NOTE — Progress Notes (Signed)
Patient is eating, ambulating, voiding.  Pain control is good.  Appropriate lochia, no complaints.   Vitals:   11/25/20 2348 11/26/20 0530 11/26/20 0758 11/26/20 1017  BP: 127/76 136/82 (!) 148/83 (!) 136/91  Pulse: 64 67 80 (!) 121  Resp: 17 19 18    Temp: 98.6 F (37 C) 98.9 F (37.2 C) 98.7 F (37.1 C)   TempSrc:  Oral Oral   SpO2: 100% 98% 99%   Weight:      Height:        Fundus firm Inc: pressure dressing in place Ext: no calf tenderness, trace edema bilaterally  Lab Results  Component Value Date   WBC 14.2 (H) 11/24/2020   HGB 10.3 (L) 11/24/2020   HCT 30.8 (L) 11/24/2020   MCV 86.0 11/24/2020   PLT 182 11/24/2020    --/--/AB POS (02/04 1846)  A/P 03-19-2001 POD#3 s/p RCS at 34 weeks for fetal growth restriction and severe preeclampsia  Severe preeclampsia--S/p MgSO4,Received IV labetalol in MAU, but was not started on any PO meds.  Bps have been intermittently elevated over the last 24 hours.  Will start procardia XL 30mg  daily now and continue to monitor as inpatient for possible dose escalation Baby in NICU requiring care for lung immaturity.   [facility] GEFFEL 

## 2020-11-27 ENCOUNTER — Encounter (HOSPITAL_COMMUNITY): Payer: Self-pay | Admitting: Obstetrics and Gynecology

## 2020-11-27 LAB — SURGICAL PATHOLOGY

## 2020-11-27 MED ORDER — NIFEDIPINE ER OSMOTIC RELEASE 60 MG PO TB24: 60.0000 mg | ORAL_TABLET | Freq: Every day | ORAL | 1 refills | Status: AC

## 2020-11-27 MED ORDER — IBUPROFEN 600 MG PO TABS: 600.0000 mg | ORAL_TABLET | Freq: Four times a day (QID) | ORAL | 0 refills | Status: DC | PRN

## 2020-11-27 MED ORDER — OXYCODONE-ACETAMINOPHEN 5-325 MG PO TABS: 1.0000 | ORAL_TABLET | Freq: Four times a day (QID) | ORAL | 0 refills | Status: DC | PRN

## 2020-11-27 NOTE — Discharge Summary (Signed)
Postpartum Discharge Summary       Patient Name: Alison Ellison DOB: Dec 03, 1990 MRN: [id number]  Date of admission: 11/23/2020 Delivery date:11/23/2020  Delivering provider: Allyn Kenner  Date of discharge: 11/27/2020  Admitting diagnosis: History of cesarean delivery [Z98.891] Cesarean delivery delivered [O82] Intrauterine pregnancy: [redacted]w[redacted]d    Secondary diagnosis:  Active Problems:   History of cesarean delivery   Cesarean delivery delivered      Discharge diagnosis: Preterm Pregnancy Delivered , IUGR, severe pre-eclampsia                                             Post partum procedures:None Augmentation: N/A Complications: None  Hospital course: Sceduled C/S   30y.o. yo G3P0212 at 359w0das admitted to the hospital 11/23/2020 for scheduled cesarean section with the following indication:Severe pre-eclampsia, history of prior cesarean.Delivery details are as follows:  Membrane Rupture Time/Date: 9:00 PM ,11/23/2020   Delivery Method:C-Section, Low Transverse  Details of operation can be found in separate operative note.  Patient had an uncomplicated postpartum course.  She is ambulating, tolerating a regular diet, passing flatus, and urinating well. Patient is discharged home in stable condition on  11/27/20        Newborn Data: Birth date:11/23/2020  Birth time:9:03 PM  Gender:Female  Living status:Living  Apgars:5 ,8  Weight:1860 g     Magnesium Sulfate received: Yes: Seizure prophylaxis BMZ received: Yes Rhophylac:No MMR:No T-DaP:Given prenatally Flu: N/A Transfusion:No  Physical exam  Vitals:   11/26/20 2009 11/27/20 0013 11/27/20 0437 11/27/20 0742  BP: 139/83 (!) 143/87 137/89 136/84  Pulse: (!) 111 97 (!) 102 (!) 104  Resp: 18 16 18 18   Temp: 98.6 F (37 C) 98.3 F (36.8 C) 98.7 F (37.1 C) 98.8 F (37.1 C)  TempSrc: Oral Oral Oral Oral  SpO2: 100% 100% 100% 100%  Weight:      Height:       General: alert, cooperative and no distress Lochia:  appropriate Uterine Fundus: firm Incision: Healing well with no significant drainage DVT Evaluation: No evidence of DVT seen on physical exam. Labs: Lab Results  Component Value Date   WBC 14.2 (H) 11/24/2020   HGB 10.3 (L) 11/24/2020   HCT 30.8 (L) 11/24/2020   MCV 86.0 11/24/2020   PLT 182 11/24/2020   CMP Latest Ref Rng & Units 11/24/2020  Glucose 70 - 99 mg/dL 113(H)  BUN 6 - 20 mg/dL 5(L)  Creatinine 0.44 - 1.00 mg/dL 0.80  Sodium 135 - 145 mmol/L 137  Potassium 3.5 - 5.1 mmol/L 5.3(H)  Chloride 98 - 111 mmol/L 107  CO2 22 - 32 mmol/L 19(L)  Calcium 8.9 - 10.3 mg/dL 8.0(L)  Total Protein 6.5 - 8.1 g/dL 5.4(L)  Total Bilirubin 0.3 - 1.2 mg/dL 0.8  Alkaline Phos 38 - 126 U/L 61  AST 15 - 41 U/L 30  ALT 0 - 44 U/L 19   Edinburgh Score: Edinburgh Postnatal Depression Scale Screening Tool 11/24/2020  I have been able to laugh and see the funny side of things. 0  I have looked forward with enjoyment to things. 0  I have blamed myself unnecessarily when things went wrong. 1  I have been anxious or worried for no good reason. 1  I have felt scared or panicky for no good reason. 0  Things have been getting on top of  me. 1  I have been so unhappy that I have had difficulty sleeping. 0  I have felt sad or miserable. 0  I have been so unhappy that I have been crying. 0  The thought of harming myself has occurred to me. 0  Edinburgh Postnatal Depression Scale Total 3      After visit meds: 1) Procardia 24m XL 2) Percocet  3) Ibuprofen    Discharge home in stable condition Infant Feeding: Bottle and Breast Infant Disposition:NICU Discharge instruction: per After Visit Summary and Postpartum booklet. Activity: Advance as tolerated. Pelvic rest for 6 weeks.  Diet: routine diet Anticipated Birth Control: Unsure Postpartum Appointment:2-3 days Additional Postpartum F/U:  Future Appointments:No future appointments. Follow up Visit:  Follow-up Information    RVanessa Kick  MD Follow up on 12/03/2020.   Specialty: Obstetrics and Gynecology Why: for a BP check Contact information: [phone]                   11/27/2020 KVanessa Kick MD

## 2020-11-27 NOTE — Progress Notes (Signed)
Teaching complete  Pt will buy a breast pump and does not want to rent one

## 2020-11-27 NOTE — Lactation Note (Signed)
This note was copied from a baby's chart. Lactation Consultation Note  Patient Name: Alison Ellison JOITG'P Date: 11/27/2020 Reason for consult: NICU baby;Follow-up assessment Age:30 days  Mother to d/c today. She is pumping without difficulty today and denies continued engorgement. Mom will use electric pump in NICU and hand pump at home until her insurance-provided pump arrives. She was certified on the [facility] program during her pregnancy and is aware that she is eligible for a WIC pump until her pump arrives. She will f/u with LC if she desires a referral for pump. Patient was provided with the opportunity to ask questions. All concerns were addressed.  Will plan follow up visit in NICU.  Discharge Discharge Education: Engorgement and breast care Pump: Manual  Consult Status Consult Status: Follow-up Follow-up type: In-patient   Elder Negus, MA IBCLC 11/27/2020, 12:11 PM

## 2020-11-29 ENCOUNTER — Ambulatory Visit: Payer: Self-pay

## 2020-11-29 NOTE — Lactation Note (Signed)
This note was copied from a baby's chart. Lactation Consultation Note  Patient Name: Alison Ellison [id number] Date: 11/29/2020 Reason for consult: Mother's request;Follow-up assessment Age:30  LC to infant's room for requested visit with mother who is 5 days post partum. Mom has copious milk and is pumping frequently. She has slight engorgement that she is managing. LC provided a hands-free top pumping top. Encouraged icing breasts between pumpings and using breast massage for improved emptying of breasts. LC will plan f/u next week. Mom is aware that [facility] services are available prn. Mother was provided with the opportunity to ask questions. All concerns were addressed.    Consult Status Consult Status: Follow-up Follow-up type: In-patient   Elder Negus, MA IBCLC 11/29/2020, 9:04 AM

## 2020-11-30 ENCOUNTER — Ambulatory Visit: Payer: Medicaid Other

## 2020-11-30 ENCOUNTER — Other Ambulatory Visit: Payer: Medicaid Other

## 2020-12-07 ENCOUNTER — Other Ambulatory Visit: Payer: Medicaid Other

## 2020-12-07 ENCOUNTER — Ambulatory Visit: Payer: Medicaid Other

## 2020-12-12 ENCOUNTER — Ambulatory Visit: Payer: Self-pay

## 2020-12-12 NOTE — Lactation Note (Signed)
This note was copied from a baby's chart. Lactation Consultation Note  Patient Name: Boy Sabiha Sura [id number] Date: 12/12/2020 Reason for consult: NICU baby;Follow-up assessment Age:30 wk.o. Infant Cristal Deer is not yet ready for direct bf. Mom pumps and supplies milk.  Maternal Data  Mom with copious milk supply. Provided additional bottles.   Feeding Mother's Current Feeding Choice: Breast Milk   Lactation Tools Discussed/Used Pumping frequency: 8xd Pumped volume: 180 mLper pumping session   Consult Status Consult Status: Follow-up Follow-up type: In-patient Will plan f/u visit next week. Mom aware of LC services.   Elder Negus, MA IBCLC 12/12/2020, 10:24 AM

## 2020-12-17 ENCOUNTER — Ambulatory Visit: Payer: Self-pay

## 2020-12-17 NOTE — Lactation Note (Signed)
This note was copied from a baby's chart. Lactation Consultation Note  Patient Name: Alison Ellison AJOIN'O Date: 12/17/2020 Reason for consult: NICU baby;Follow-up assessment Age:30 wk.o.  LC visited briefly with mom during SLP consult. Infant is not yet able to bf. Mom continues to pump with adequate supply. She is aware of LC services but has no questions/concerns at this time. Will plan f/u visit next week.   Feeding Mother's Current Feeding Choice: Breast Milk  Lactation Tools Discussed/Used Pumping frequency: q3 Pumped volume: 180 mL   Consult Status Consult Status: Follow-up Follow-up type: In-patient   Elder Negus, MA IBCLC 12/17/2020, 12:52 PM

## 2020-12-28 ENCOUNTER — Inpatient Hospital Stay (HOSPITAL_COMMUNITY): Admit: 2020-12-28 | Payer: BC Managed Care – PPO | Admitting: Obstetrics and Gynecology

## 2021-03-11 ENCOUNTER — Other Ambulatory Visit: Payer: BC Managed Care – PPO

## 2021-03-11 ENCOUNTER — Ambulatory Visit: Payer: BC Managed Care – PPO | Attending: Obstetrics and Gynecology

## 2021-03-11 DIAGNOSIS — Z20822 Contact with and (suspected) exposure to covid-19: Secondary | ICD-10-CM

## 2021-03-12 LAB — NOVEL CORONAVIRUS, NAA: SARS-CoV-2, NAA: DETECTED — AB

## 2021-03-12 LAB — SARS-COV-2, NAA 2 DAY TAT

## 2022-07-19 IMAGING — DX DG CHEST 1V
1 series · 1 of 1 positions shown · non-contrast
Comparison: None.

CLINICAL DATA: Chest pain, cough, congestion, and sore throat.

EXAM:
CHEST  1 VIEW

[chest]
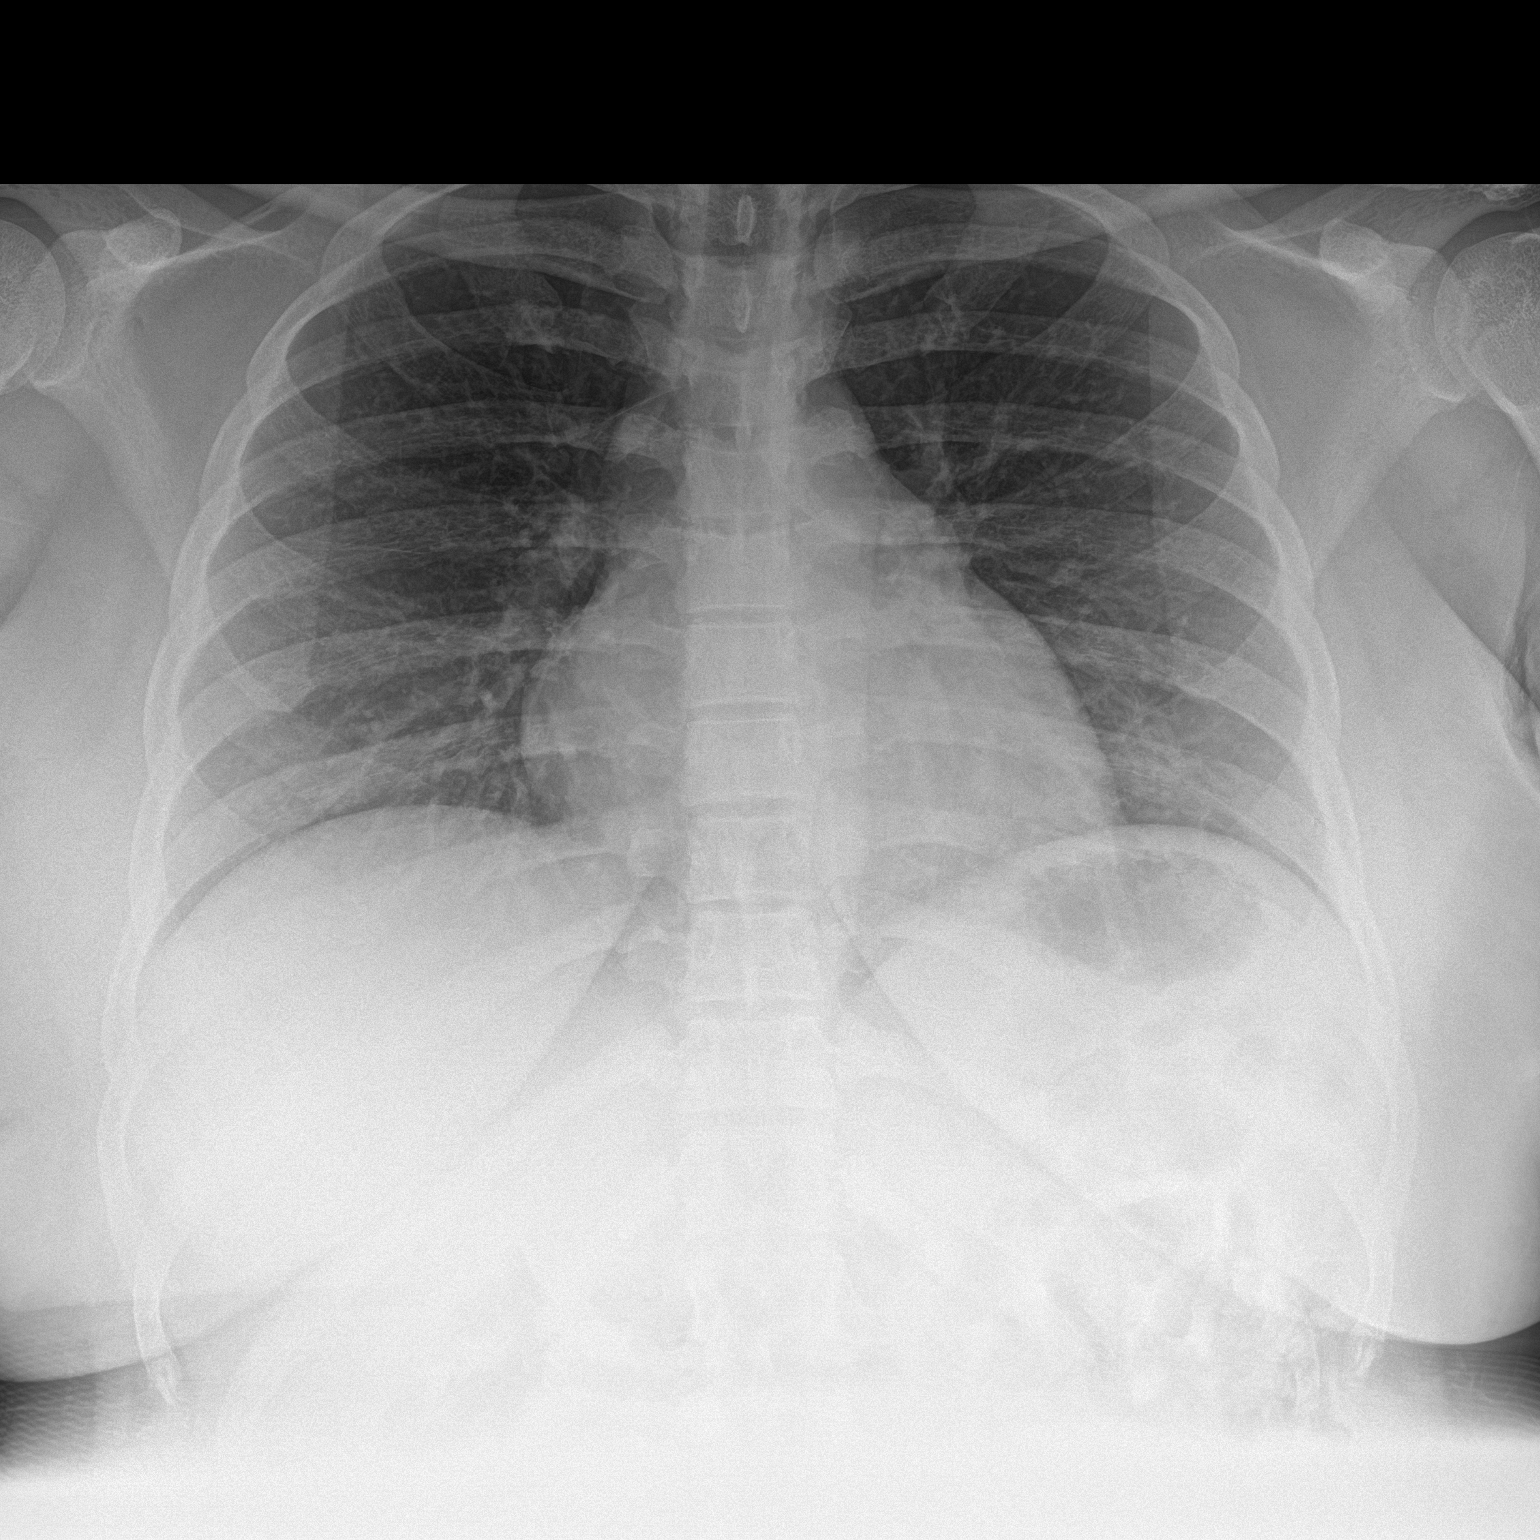

[1 of 1 positions shown; findings below may reference images not displayed]

FINDINGS: The heart size and mediastinal contours are within normal limits.
Both lungs are clear. The visualized skeletal structures are
unremarkable.
IMPRESSION: No active disease.

## 2022-09-21 IMAGING — US US MFM FETAL BPP W/O NON-STRESS
1 series · 14 of 28 positions shown · non-contrast
Comparison: none

[Series 1: us mfm fetal bpp w/o non-stress · 60 acquisitions, 14 frames shown]
[im 3/60]
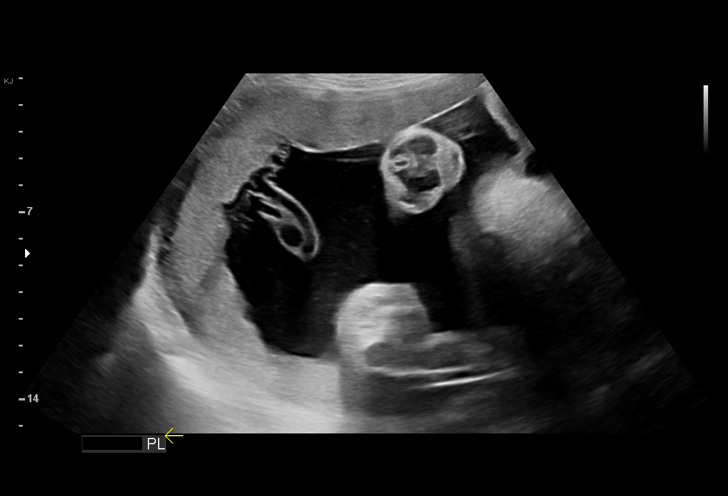
[im 7/60]
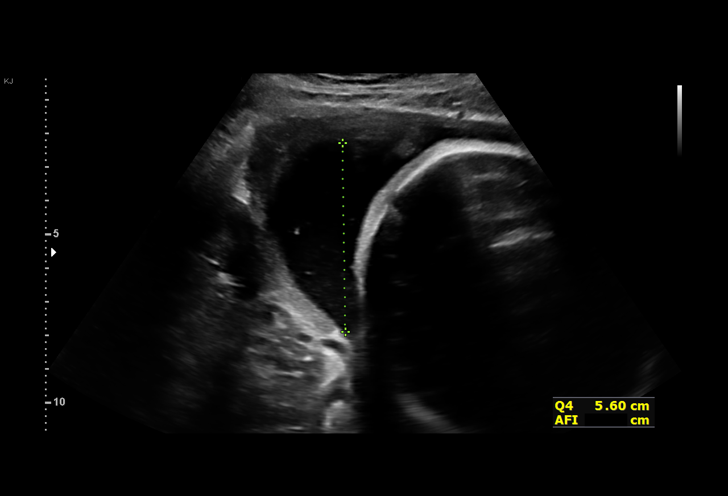
[im 11/60]
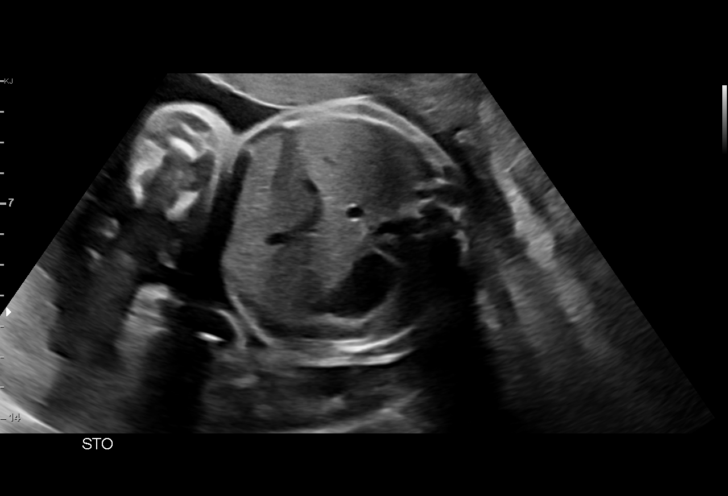
[im 16/60]
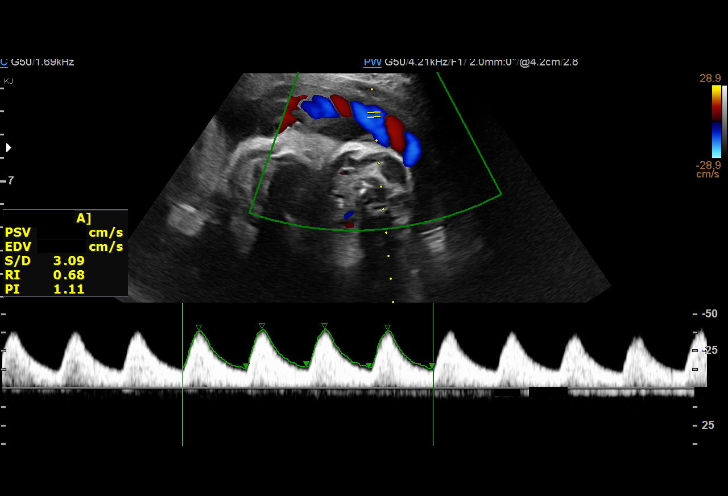
[im 20/60]
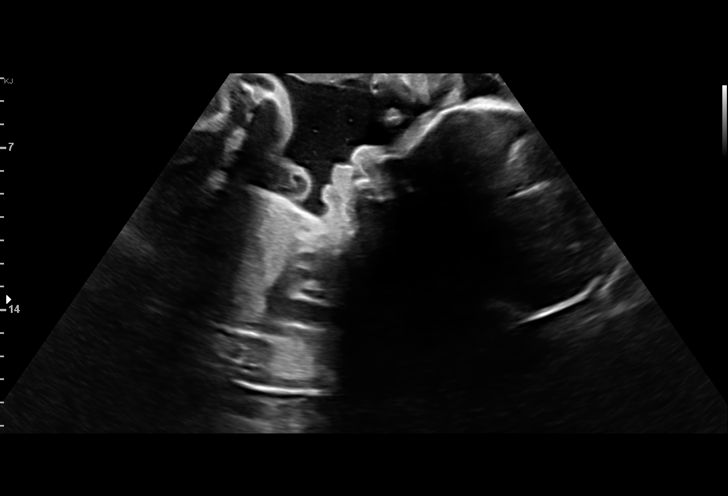
[im 25/60]
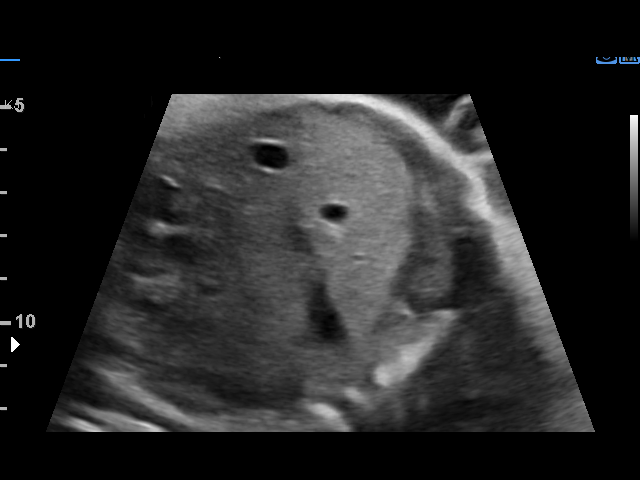
[im 29/60]
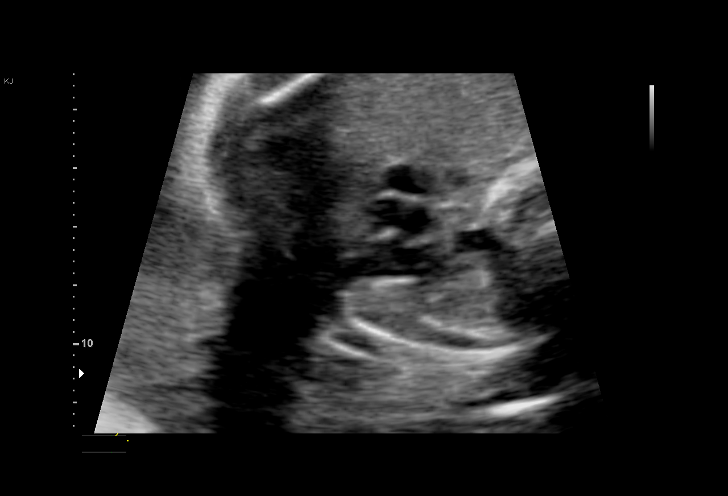
[im 33/60]
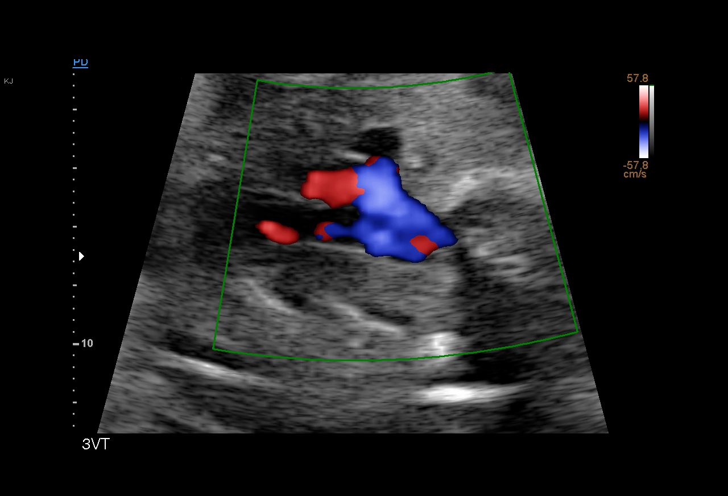
[im 38/60]
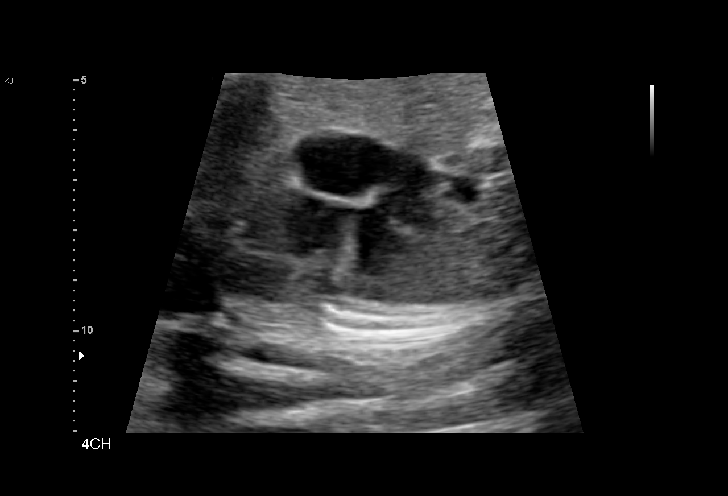
[im 42/60]
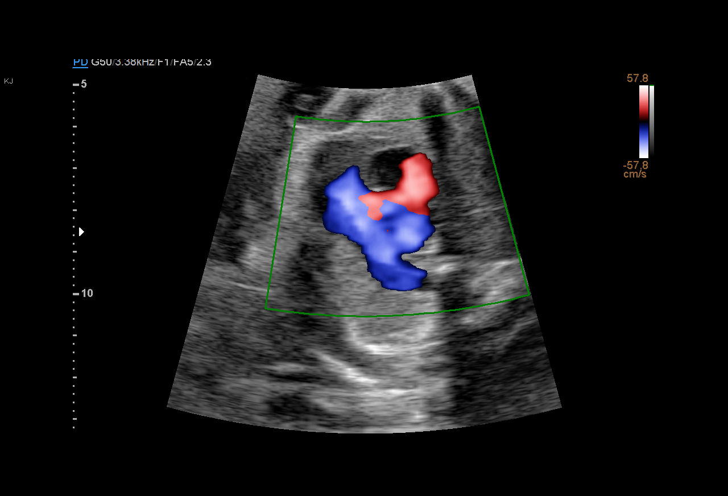
[im 46/60]
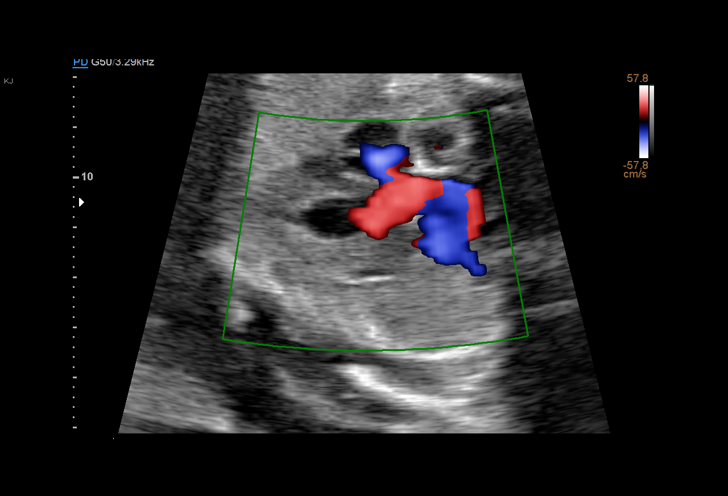
[im 51/60]
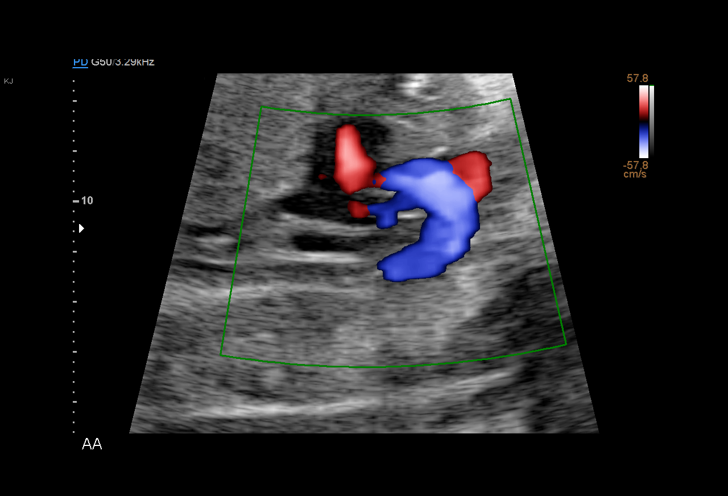
[im 55/60]
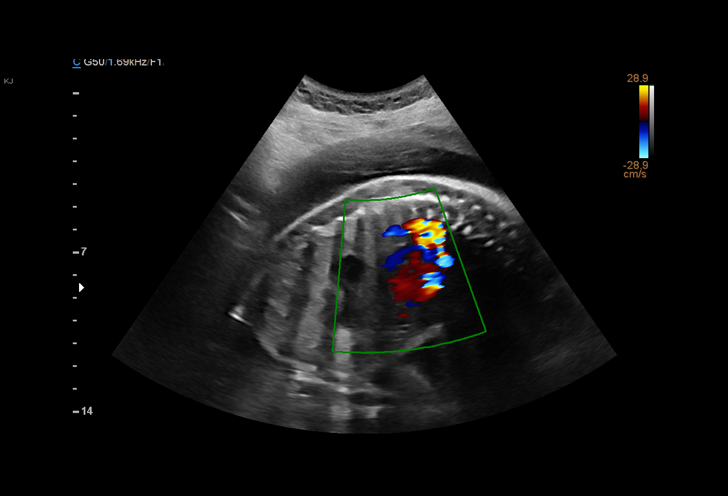
[im 60/60]
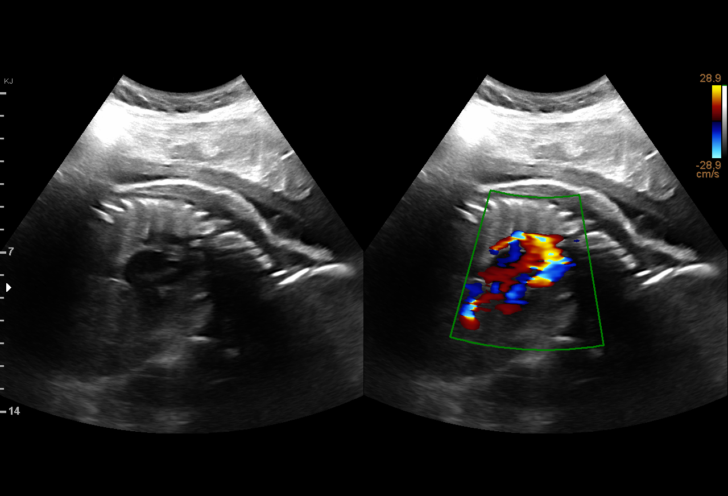

[14 of 28 positions shown; findings below may reference images not displayed]

OBGYN
                   RICOPA

Indications

 33 weeks gestation of pregnancy
Fetal Evaluation

 Num Of Fetuses:         1
 Fetal Heart Rate(bpm):  123
 Cardiac Activity:       Observed
 Presentation:           Cephalic
 Placenta:               Fundal
 P. Cord Insertion:      Visualized, central

 Amniotic Fluid
 AFI FV:      Within normal limits

 AFI Sum(cm)     %Tile       Largest Pocket(cm)
 20.31           77

 RUQ(cm)       RLQ(cm)       LUQ(cm)        LLQ(cm)

Biophysical Evaluation
 Amniotic F.V:   Within normal limits       F. Tone:        Observed
 F. Movement:    Observed                   Score:          [DATE]
 F. Breathing:   Observed
Biometry

 LV:        2.4  mm
OB History

 Gravidity:    3         Term:   1
 TOP:          1        Living:  1
Gestational Age

 LMP:           33w 0d        Date:  03/30/20                 EDD:   01/04/21
 Best:          33w 0d     Det. By:  LMP  (03/30/20)          EDD:   01/04/21
Anatomy

 Cranium:               Appears normal         Aortic Arch:            Appears normal
 Cavum:                 Appears normal         Ductal Arch:            Not well visualized
 Ventricles:            Appears normal         Diaphragm:              Appears normal
 Choroid Plexus:        Appears normal         Stomach:                Appears normal, left
                                                                       sided
 Cerebellum:            Appears normal         Abdomen:                Appears normal
 Posterior Fossa:       Appears normal         Abdominal Wall:         Previously seen
 Nuchal Fold:           Not applicable (>20    Cord Vessels:           Previously seen
                        wks GA)
 Face:                  Previously seen        Kidneys:                Appear normal
 Lips:                  Previously seen        Bladder:                Appears normal
 Thoracic:              Appears normal         Spine:                  Previously seen
 Heart:                 Not well visualized    Upper Extremities:      Previously seen
 RVOT:                  Appears normal         Lower Extremities:      Previously seen
 LVOT:                  Appears normal

 Other:  SVC IVC appears normal. 3VV/T appears normal.
Doppler - Fetal Vessels

 Umbilical Artery
  S/D     %tile      RI    %tile      PI    %tile
  3.32       85     0.7       87    1.18       93

Impression

 Antenatal testing due to IUGR with an EFW 4.4th% with an
 AC < 6th%.
 Biophysical profile [DATE] with good fetal movement and
 amniotic fluid volume
 UA Dopplers are normal with no evidence of AEDF or REDF

 Suboptimal views of the fetal anatomy was again obtained
 secondary to fetal position.
Recommendations

 Continue weekly testing with UA Dopplers
 Repeat growth in 3 weeks.

## 2022-12-03 ENCOUNTER — Other Ambulatory Visit: Payer: Self-pay

## 2022-12-03 ENCOUNTER — Encounter (HOSPITAL_BASED_OUTPATIENT_CLINIC_OR_DEPARTMENT_OTHER): Payer: Self-pay | Admitting: Emergency Medicine

## 2022-12-03 ENCOUNTER — Emergency Department (HOSPITAL_BASED_OUTPATIENT_CLINIC_OR_DEPARTMENT_OTHER)
Admission: EM | Admit: 2022-12-03 | Discharge: 2022-12-03 | Disposition: A | Discharge status: Home / Self Care | Payer: Medicaid Other | Attending: Emergency Medicine | Admitting: Emergency Medicine

## 2022-12-03 DIAGNOSIS — R000 Tachycardia, unspecified: Secondary | ICD-10-CM | POA: Insufficient documentation

## 2022-12-03 DIAGNOSIS — R509 Fever, unspecified: Secondary | ICD-10-CM | POA: Diagnosis present

## 2022-12-03 DIAGNOSIS — Z20822 Contact with and (suspected) exposure to covid-19: Secondary | ICD-10-CM | POA: Insufficient documentation

## 2022-12-03 DIAGNOSIS — J101 Influenza due to other identified influenza virus with other respiratory manifestations: Secondary | ICD-10-CM | POA: Insufficient documentation

## 2022-12-03 LAB — RESP PANEL BY RT-PCR (RSV, FLU A&B, COVID)  RVPGX2
Influenza A by PCR: NEGATIVE
Influenza B by PCR: POSITIVE — AB
Resp Syncytial Virus by PCR: NEGATIVE
SARS Coronavirus 2 by RT PCR: NEGATIVE

## 2022-12-03 LAB — GROUP A STREP BY PCR: Group A Strep by PCR: NOT DETECTED

## 2022-12-03 MED ORDER — ONDANSETRON 4 MG PO TBDP: 4.0000 mg | ORAL_TABLET | Freq: Three times a day (TID) | ORAL | 0 refills | Status: AC | PRN

## 2022-12-03 MED ORDER — ACETAMINOPHEN 500 MG PO TABS
1000.0000 mg | ORAL_TABLET | Freq: Once | ORAL | Status: AC
Start: 2022-12-03 — End: 2022-12-03
  Administered 2022-12-03: 1000 mg via ORAL
  Filled 2022-12-03: qty 2

## 2022-12-03 NOTE — ED Provider Notes (Signed)
[place] [facility] Provider Note   [name]: [id number] Arrival date & time: 12/03/22  1028     History  Chief Complaint  Patient presents with   URI    Alison Ellison is a 32 y.o. female.   URI    32 year old female presenting to the emerged part with flulike illness.  The patient states that since this past Saturday, she has had a fever at home subjectively, congestion, nonproductive cough, chills and a sore throat with associated headaches and myalgias.  She has not had anything to eat since Saturday but is tolerating oral liquids.  She endorses mild nausea, denies any abdominal pain, vomiting, diarrhea.  Home Medications Prior to Admission medications   Medication Sig Start Date End Date Taking? Authorizing Provider  ondansetron (ZOFRAN-ODT) 4 MG disintegrating tablet Take 1 tablet (4 mg total) by mouth every 8 (eight) hours as needed for nausea or vomiting. 12/03/22  Yes Regan Lemming, MD  acetaminophen (TYLENOL) 500 MG tablet Take 1,000 mg by mouth every 6 (six) hours as needed for mild pain or headache.    [provider]  ibuprofen (ADVIL) 600 MG tablet Take 1 tablet (600 mg total) by mouth every 6 (six) hours as needed. 11/27/20   Vanessa Kick, MD  NIFEdipine (PROCARDIA XL) 60 MG 24 hr tablet Take 1 tablet (60 mg total) by mouth daily. 11/27/20   Vanessa Kick, MD  oxyCODONE-acetaminophen (PERCOCET/ROXICET) 5-325 MG tablet Take 1-2 tablets by mouth every 6 (six) hours as needed for severe pain. 11/27/20   Vanessa Kick, MD  Prenatal Vit-Fe Fumarate-FA (PRENATAL MULTIVITAMIN) TABS tablet Take 1 tablet by mouth daily at 12 noon.    [provider]      Allergies    Patient has no known allergies.    Review of Systems   Review of Systems  All other systems reviewed and are negative.   Physical Exam Updated Vital Signs BP (!) 129/90 (BP Location: Left Arm)   Pulse (!) 118   Temp 99 F (37.2 C) (Oral)   Resp 20   Ht 5'  (1.524 m)   Wt 79.4 kg   SpO2 98%   BMI 34.18 kg/m  Physical Exam Vitals and nursing note reviewed.  Constitutional:      General: She is not in acute distress.    Appearance: She is well-developed.  HENT:     Head: Normocephalic and atraumatic.     Comments: Mild posterior oropharyngeal erythema Eyes:     Conjunctiva/sclera: Conjunctivae normal.  Cardiovascular:     Rate and Rhythm: Normal rate and regular rhythm.  Pulmonary:     Effort: Pulmonary effort is normal. No respiratory distress.     Breath sounds: Normal breath sounds.  Abdominal:     Palpations: Abdomen is soft.     Tenderness: There is no abdominal tenderness.  Musculoskeletal:        General: No swelling.     Cervical back: Neck supple.  Skin:    General: Skin is warm and dry.     Capillary Refill: Capillary refill takes less than 2 seconds.  Neurological:     Mental Status: She is alert.  Psychiatric:        Mood and Affect: Mood normal.     ED Results / Procedures / Treatments   Labs (all labs ordered are listed, but only abnormal results are displayed) Labs Reviewed  RESP PANEL BY RT-PCR (RSV, FLU A&B, COVID)  RVPGX2 - Abnormal;  Notable for the following components:      Result Value   Influenza B by PCR POSITIVE (*)    All other components within normal limits  GROUP A STREP BY PCR    EKG None  Radiology No results found.  Procedures Procedures    Medications Ordered in ED Medications  acetaminophen (TYLENOL) tablet 1,000 mg (1,000 mg Oral Given 12/03/22 1202)    ED Course/ Medical Decision Making/ A&P Clinical Course as of 12/03/22 1205  Wed Dec 03, 2022  1158 Influenza B By PCR(!): POSITIVE [JL]    Clinical Course User Index [JL] Regan Lemming, MD                             Medical Decision Making Amount and/or Complexity of Data Reviewed Labs:  Decision-making details documented in ED Course.  Risk OTC drugs. Prescription drug management.    32 year old female  presenting to the emerged part with flulike illness.  The patient states that since this past Saturday, she has had a fever at home subjectively, congestion, nonproductive cough, chills and a sore throat with associated headaches and myalgias.  She has not had anything to eat since Saturday but is tolerating oral liquids.  She endorses mild nausea, denies any abdominal pain, vomiting, diarrhea.  On arrival, the patient was afebrile, temperature 99, tachycardic heart rate 118, not tachypneic RR 20, saturating 98% on room air, BP 129/90.  Patient overall well-appearing on exam, mild posterior oropharyngeal erythema on my exam.  On my exam, the patient is well-appearing and well-hydrated.  The patient's lungs are clear to auscultation bilaterally. Additionally, the patient has a soft/non-tender abdomen, clear tympanic membranes, and no oropharyngeal exudates.  There are no signs of meningismus.  I see no signs of an acute bacterial infection.  The patient's presentation is most consistent with a viral upper respiratory infection.  I have a low suspicion for pneumonia as the patient's cough has been non-productive and the patient is neither tachypneic nor hypoxic on room air.  Additionally, the patient is CTAB.  Influenza-like illness is possible, suspect likely viral URI.  COVID-19, influenza, RSV PCR testing was collected and resulted positive for influenza B.  Patient is overall well-appearing, tolerating oral liquids.  She was administered Tylenol for generalized muscle aches.  She is outside the window for Tamiflu.  Given mild nausea, will discharge with prescription for Zofran.  I discussed symptomatic management, including hydration, motrin, and tylenol. The patient felt safe being discharged from the ED.  They agreed to followup with the PCP if needed.  I provided ED return precautions.   Final Clinical Impression(s) / ED Diagnoses Final diagnoses:  Influenza B    Rx / DC Orders ED Discharge  Orders          Ordered    ondansetron (ZOFRAN-ODT) 4 MG disintegrating tablet  Every 8 hours PRN        12/03/22 1202              Regan Lemming, MD 12/03/22 1205

## 2022-12-03 NOTE — ED Triage Notes (Signed)
Fever, congestion, coughing, chills, sore throat, headache, bodyaches since Saturday.

## 2022-12-03 NOTE — Discharge Instructions (Signed)
Take Tylenol and ibuprofen for muscle aches and fever.  Continue to push oral rehydration with electrolyte containing solutions like Gatorade and Pedialyte.  Zofran has been prescribed for mild nausea.

## 2023-07-18 ENCOUNTER — Ambulatory Visit
Admission: EM | Admit: 2023-07-18 | Discharge: 2023-07-18 | Disposition: A | Discharge status: Home / Self Care | Payer: Medicaid Other | Attending: Physician Assistant | Admitting: Physician Assistant

## 2023-07-18 DIAGNOSIS — Z113 Encounter for screening for infections with a predominantly sexual mode of transmission: Secondary | ICD-10-CM | POA: Insufficient documentation

## 2023-07-18 DIAGNOSIS — N3000 Acute cystitis without hematuria: Secondary | ICD-10-CM | POA: Diagnosis present

## 2023-07-18 LAB — POCT URINALYSIS DIP (MANUAL ENTRY)
Bilirubin, UA: NEGATIVE
Glucose, UA: NEGATIVE mg/dL
Ketones, POC UA: NEGATIVE mg/dL
Nitrite, UA: NEGATIVE
Protein Ur, POC: 100 mg/dL — AB
Spec Grav, UA: 1.02 (ref 1.010–1.025)
Urobilinogen, UA: 0.2 U/dL
pH, UA: 6.5 (ref 5.0–8.0)

## 2023-07-18 MED ORDER — CIPROFLOXACIN HCL 500 MG PO TABS
500.0000 mg | ORAL_TABLET | Freq: Two times a day (BID) | ORAL | 0 refills | Status: AC
Start: 2023-07-18 — End: ?

## 2023-07-18 NOTE — ED Provider Notes (Signed)
[name]    [name]: [id number] Arrival date & time: 07/18/23  1048      History   Chief Complaint Chief Complaint  Patient presents with   UTI Symptoms    HPI Alison Ellison is a 32 y.o. female.   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 Authorizing Provider  levonorgestrel (MIRENA, 52 MG,) 20 MCG/DAY IUD 1 each by Intrauterine route once. 08/17/17  Yes [provider]  montelukast (SINGULAIR) 10 MG tablet Take 10 mg by mouth at bedtime. 01/11/20  Yes [provider]  Multiple Vitamin (MULTIVITAMIN) TABS Take 1 tablet by mouth daily. 08/20/18  Yes [provider]  acetaminophen (TYLENOL) 500 MG tablet Take 1,000 mg by mouth every 6 (six) hours as needed for mild pain or headache.    [provider]  ibuprofen (ADVIL) 600 MG tablet Take 1 tablet (600 mg total) by mouth every 6 (six) hours as needed. 11/27/20   Waynard Reeds, MD  NIFEdipine (PROCARDIA XL) 60 MG 24 hr tablet Take 1 tablet (60 mg total) by mouth daily. 11/27/20   Waynard Reeds, MD   ondansetron (ZOFRAN-ODT) 4 MG disintegrating tablet Take 1 tablet (4 mg total) by mouth every 8 (eight) hours as needed for nausea or vomiting. 12/03/22   Ernie Avena, MD  oxyCODONE-acetaminophen (PERCOCET/ROXICET) 5-325 MG tablet Take 1-2 tablets by mouth every 6 (six) hours as needed for severe pain. 11/27/20   Waynard Reeds, MD  Prenatal Vit-Fe Fumarate-FA (PRENATAL MULTIVITAMIN) TABS tablet Take 1 tablet by mouth daily at 12 noon.    [provider]    Family History Family History  Problem Relation Age of Onset   Diabetes Neg Hx    Hypertension Neg Hx     Social History Social History   Tobacco Use   Smoking status: Never   Smokeless tobacco: Never  Vaping Use   Vaping status: Never Used  Substance Use Topics   Alcohol use: No   Drug use: No     Allergies   Aspirin   Review of Systems Review of Systems  Constitutional:  Negative for chills and fever.  Eyes:  Negative for discharge and redness.  Gastrointestinal:  Negative for abdominal pain, nausea and vomiting.     Physical  Exam Triage Vital Signs ED Triage Vitals  Encounter Vitals Group     BP      Systolic BP Percentile      Diastolic BP Percentile      Pulse      Resp      Temp      Temp src      SpO2      Weight      Height      Head Circumference      Peak Flow      Pain Score      Pain Loc      Pain Education      Exclude from Growth Chart    No data found.  Updated Vital Signs There were no vitals taken for this visit.   Physical Exam Vitals and nursing note reviewed.  Constitutional:      General: She is not in acute distress.    Appearance: Normal appearance. She is not ill-appearing.  HENT:     Head: Normocephalic and atraumatic.  Eyes:     Conjunctiva/sclera: Conjunctivae normal.  Cardiovascular:     Rate and Rhythm: Normal rate.  Pulmonary:     Effort: Pulmonary effort is normal.  Neurological:     Mental Status: She is alert.  Psychiatric:        Mood and  Affect: Mood normal.        Behavior: Behavior normal.        Thought Content: Thought content normal.      UC Treatments / Results  Labs (all labs ordered are listed, but only abnormal results are displayed) Labs Reviewed  POCT URINALYSIS DIP (MANUAL ENTRY)    EKG   Radiology No results found.  Procedures Procedures (including critical care time)  Medications Ordered in UC Medications - No data to display  Initial Impression / Assessment and Plan / UC Course  I have reviewed the triage vital signs and the nursing notes.  Pertinent labs & imaging results that were available during my care of the patient were reviewed by me and considered in my medical decision making (see chart for details).     *** Final Clinical Impressions(s) / UC Diagnoses   Final diagnoses:  None   Discharge Instructions   None    ED Prescriptions   None    PDMP not reviewed this encounter.

## 2023-07-18 NOTE — ED Triage Notes (Signed)
"  I think I may have a UTI, my right side is hurting as well".  Symptoms started on Tuesday with frequency in urination, pain at the end of peeing, now right side pain today".  No vaginal discharge.  "I would like Sti testing as well". I would blood work as well.

## 2023-07-20 ENCOUNTER — Telehealth: Payer: Self-pay | Admitting: Emergency Medicine

## 2023-07-20 LAB — URINE CULTURE: Culture: 40000 — AB

## 2023-07-20 LAB — CERVICOVAGINAL ANCILLARY ONLY
Bacterial Vaginitis (gardnerella): NEGATIVE
Candida Glabrata: NEGATIVE
Candida Vaginitis: POSITIVE — AB
Chlamydia: NEGATIVE
Comment: NEGATIVE
Comment: NEGATIVE
Comment: NEGATIVE
Comment: NEGATIVE
Comment: NEGATIVE
Comment: NORMAL
Neisseria Gonorrhea: NEGATIVE
Trichomonas: NEGATIVE

## 2023-07-20 MED ORDER — FLUCONAZOLE 150 MG PO TABS: 150.0000 mg | ORAL_TABLET | Freq: Once | ORAL | 0 refills | Status: AC

## 2023-07-20 NOTE — Telephone Encounter (Signed)
Diflucan for positive yeast

## 2023-07-21 ENCOUNTER — Encounter: Payer: Self-pay | Admitting: Physician Assistant

## 2023-07-21 LAB — HIV ANTIBODY (ROUTINE TESTING W REFLEX): HIV Screen 4th Generation wRfx: NONREACTIVE

## 2023-07-21 LAB — RPR: RPR Ser Ql: NONREACTIVE

## 2024-08-15 ENCOUNTER — Ambulatory Visit (INDEPENDENT_AMBULATORY_CARE_PROVIDER_SITE_OTHER): Admitting: Podiatry

## 2024-08-15 ENCOUNTER — Encounter: Payer: Self-pay | Admitting: Podiatry

## 2024-08-15 ENCOUNTER — Ambulatory Visit (INDEPENDENT_AMBULATORY_CARE_PROVIDER_SITE_OTHER)

## 2024-08-15 VITALS — Ht 60.0 in | Wt 180.0 lb

## 2024-08-15 DIAGNOSIS — M2011 Hallux valgus (acquired), right foot: Secondary | ICD-10-CM

## 2024-08-15 DIAGNOSIS — M21611 Bunion of right foot: Secondary | ICD-10-CM

## 2024-08-15 DIAGNOSIS — M21619 Bunion of unspecified foot: Secondary | ICD-10-CM

## 2024-08-15 NOTE — Progress Notes (Addendum)
" ° °  Chief Complaint  Patient presents with   Bunions    Pt is here due to bunion on the right foot, states it is sometimes pain especially while wearing certain shoes, has tried bunion correctors with no relief, want to know what her options are to fix it.    Subjective: 33 y.o. female presents today as a new patient for evaluation of a painful symptomatic bunion to the right foot ongoing for several years.  Progressively this has gotten worse.  She has tried to manage with shoe gear modifications and anti-inflammatories with no improvement.  Past Medical History:  Diagnosis Date   Headache    Preterm labor     Past Surgical History:  Procedure Laterality Date   CESAREAN SECTION     CESAREAN SECTION N/A 11/23/2020   Procedure: CESAREAN SECTION;  Surgeon: Lilton Legions, DO;  Location: [facility]S;  Service: Obstetrics;  Laterality: N/A;    Allergies  Allergen Reactions   Aspirin Other (See Comments)     I feel funny--per pt  Other Reaction(s): Other (See Comments)  Reaction: Tremor (intolerance)  I have had it since and no reaction. To discuss with PCP.     Objective: Physical Exam General: The patient is alert and oriented x3 in no acute distress.  Dermatology: Skin is cool, dry and supple bilateral lower extremities. Negative for open lesions or macerations.  Vascular: Palpable pedal pulses bilaterally. No edema or erythema noted. Capillary refill within normal limits.  Neurological: Grossly intact via light touch  Musculoskeletal Exam: Clinical evidence of bunion deformity noted to the respective foot. There is moderate pain on palpation range of motion of the first MPJ. Lateral deviation of the hallux noted consistent with hallux abductovalgus.  Radiographic Exam RT foot 08/15/2024: Normal osseous mineralization.  No acute fractures identified.  Increased intermetatarsal angle 14 degrees with a hallux abductus angle 33 degrees noted on AP view.   Assessment: 1.   Moderate hallux valgus deformity right   Plan of Care:  -Patient was evaluated. X-Rays reviewed. -Today we discussed bunion etiology and pathology.  Unfortunately she has failed conservative treatment.  We discussed conservative versus surgical management.  She would like to proceed with surgery around the beginning of the new year to correct for her symptomatic bunion.  Unfortunately she has failed conservative treatment and progressively has gotten worse over the last several years affecting her daily quality of life. -Today we discussed the risk benefits advantages and disadvantages of the surgery.  No guarantees were expressed or implied.  Postoperative recovery was also explained in length in detail.  All patient questions answered. -Authorization for surgery was initiated today.  Surgery will consist of bunionectomy with osteotomy right -She has both  short-term disability as well as the ability to possibly work from home.  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 Thresa EMERSON Sar, DPM    [address] [place], [place] [address]                Office [phone]  Fax [phone]      "

## 2024-09-05 ENCOUNTER — Telehealth: Payer: Self-pay | Admitting: Podiatry

## 2024-09-05 NOTE — Telephone Encounter (Signed)
 Called and scheduled patient for surgery on 11/03/2024. Patient not on any GLP1 or blood thinners. Patient pharmacy correct in chart.

## 2024-10-08 ENCOUNTER — Encounter (HOSPITAL_BASED_OUTPATIENT_CLINIC_OR_DEPARTMENT_OTHER): Payer: Self-pay

## 2024-10-08 ENCOUNTER — Emergency Department (HOSPITAL_BASED_OUTPATIENT_CLINIC_OR_DEPARTMENT_OTHER)
Admission: EM | Admit: 2024-10-08 | Discharge: 2024-10-08 | Disposition: A | Discharge status: Home / Self Care | Attending: Emergency Medicine | Admitting: Emergency Medicine

## 2024-10-08 ENCOUNTER — Other Ambulatory Visit: Payer: Self-pay

## 2024-10-08 DIAGNOSIS — J101 Influenza due to other identified influenza virus with other respiratory manifestations: Secondary | ICD-10-CM | POA: Insufficient documentation

## 2024-10-08 DIAGNOSIS — R000 Tachycardia, unspecified: Secondary | ICD-10-CM | POA: Diagnosis not present

## 2024-10-08 DIAGNOSIS — Z79899 Other long term (current) drug therapy: Secondary | ICD-10-CM | POA: Insufficient documentation

## 2024-10-08 DIAGNOSIS — R059 Cough, unspecified: Secondary | ICD-10-CM | POA: Diagnosis present

## 2024-10-08 LAB — RESP PANEL BY RT-PCR (RSV, FLU A&B, COVID)  RVPGX2
Influenza A by PCR: POSITIVE — AB
Influenza B by PCR: NEGATIVE
Resp Syncytial Virus by PCR: NEGATIVE
SARS Coronavirus 2 by RT PCR: NEGATIVE

## 2024-10-08 MED ORDER — ACETAMINOPHEN 325 MG PO TABS
650.0000 mg | ORAL_TABLET | Freq: Once | ORAL | Status: AC
  Administered 2024-10-08: 650 mg via ORAL
  Filled 2024-10-08: qty 2

## 2024-10-08 NOTE — ED Triage Notes (Signed)
 Pt states that she has a bad cough, chills, body aches, nausea, no vomiting or diarrhea.

## 2024-10-08 NOTE — ED Notes (Signed)
 Given popsicle and po fluids

## 2024-10-08 NOTE — Discharge Instructions (Signed)
 You tested positive for the flu today.  It takes about 7-10 days for symptoms to start improving. Your COVID, RSV are negative.  Your illness is contagious and can be spread to others. It cannot be cured by antibiotics or other medicines. Take basic precautions such as washing your hands often, covering your mouth when you cough or sneeze, and avoiding public places where you could spread your illness to others.  Home care instructions:  You can take Tylenol  (acetominophen) and/or Motrin  (Ibuprofen ) as directed on the packaging for fever reduction and pain relief.  You were given your first dose of Tylenol  here today, you can take your next dose at 6pm. If you are taking Tylenol  (acetaminophen ), please check any other over the counter medications you are taking to ensure they do not also contain acetaminophen . Taking too much Tylenol /acetaminophen  can lead to severe liver damage.   For cough: honey 1/2 to 1 teaspoon (you can dilute the honey in water or another fluid).  You can also use guaifenesin and dextromethorphan for cough which are over-the-counter medications. You can use a humidifier for chest congestion and cough.  If you don't have a humidifier, you can sit in the bathroom with the hot shower running.      For sore throat: try warm salt water gargles, cepacol lozenges, throat spray, warm tea or water with lemon/honey, popsicles or ice, or OTC cold relief medicine for throat discomfort.    For congestion: Flonase  1-2 sprays in each nostril daily.    It is important to stay hydrated: drink plenty of fluids (water, gatorade/powerade/pedialyte, juices, or teas) to keep your throat moisturized and help further relieve irritation/discomfort.   You may return to normal activities (work/school) when:  - You are having improvement in symptoms  - AND have had resolution of fever without the use of fever-reducing medications for 24 hours  Follow-up instructions: Please follow-up with your primary  care provider for further evaluation of your symptoms if you are not feeling better within the next 5 days.  Return instructions:  Please return to the Emergency Department if you experience worsening symptoms.  RETURN IMMEDIATELY IF you develop shortness of breath, confusion or altered mental status, a new rash, become dizzy, faint, or poorly responsive, or are unable to be cared for at home. Please return if you have persistent vomiting and cannot keep down fluids or develop a fever that is not controlled by tylenol  or motrin .   Please return if you have any other emergent concerns.

## 2024-10-08 NOTE — ED Provider Notes (Signed)
 " [place] [facility] Provider Note   [name]: [id number] Arrival date & time: 10/08/24  1023     Patient presents with: Generalized Body Aches   Alison Ellison is a 33 y.o. female with no significant past medical history presents with concern for a dry cough, chills, body aches, and nausea that began yesterday evening.  Reports that she felt very tired and slept for 12 hours last night.  Denies any abdominal pain, vomiting, or diarrhea.    HPI     Prior to Admission medications  Medication Sig Start Date End Date Taking? Authorizing Provider  acetaminophen  (TYLENOL ) 500 MG tablet Take 1,000 mg by mouth every 6 (six) hours as needed for mild pain or headache.    [provider]  ciprofloxacin  (CIPRO ) 500 MG tablet Take 1 tablet (500 mg total) by mouth every 12 (twelve) hours. 07/18/23   Billy Asberry FALCON, PA-C  ibuprofen  (ADVIL ) 600 MG tablet Take 1 tablet (600 mg total) by mouth every 6 (six) hours as needed. 11/27/20   Okey Leader, MD  levonorgestrel (MIRENA, 52 MG,) 20 MCG/DAY IUD 1 each by Intrauterine route once. 08/17/17   [provider]  montelukast (SINGULAIR) 10 MG tablet Take 10 mg by mouth at bedtime. 01/11/20   [provider]  Multiple Vitamin (MULTIVITAMIN) TABS Take 1 tablet by mouth daily. 08/20/18   [provider]  NIFEdipine  (PROCARDIA  XL) 60 MG 24 hr tablet Take 1 tablet (60 mg total) by mouth daily. 11/27/20   Okey Leader, MD  ondansetron  (ZOFRAN -ODT) 4 MG disintegrating tablet Take 1 tablet (4 mg total) by mouth every 8 (eight) hours as needed for nausea or vomiting. 12/03/22   Jerrol Agent, MD  oxyCODONE -acetaminophen  (PERCOCET/ROXICET) 5-325 MG tablet Take 1-2 tablets by mouth every 6 (six) hours as needed for severe pain. 11/27/20   Okey Leader, MD  Prenatal Vit-Fe Fumarate-FA (PRENATAL MULTIVITAMIN) TABS tablet Take 1 tablet by mouth daily at 12 noon.    [provider]    Allergies:  Aspirin    Review of Systems  Constitutional:  Positive for chills and fatigue.    Updated Vital Signs BP 131/84 (BP Location: Right Arm)   Pulse (!) 112   Temp 99.1 F (37.3 C) (Oral)   Resp 16   Ht 5' (1.524 m)   Wt 86.6 kg   SpO2 97%   BMI 37.30 kg/m   Physical Exam Vitals and nursing note reviewed.  Constitutional:      General: She is not in acute distress.    Appearance: She is well-developed.  HENT:     Head: Normocephalic and atraumatic.     Nose: Congestion present.     Mouth/Throat:     Pharynx: No oropharyngeal exudate or posterior oropharyngeal erythema.  Eyes:     Conjunctiva/sclera: Conjunctivae normal.  Cardiovascular:     Rate and Rhythm: Regular rhythm. Tachycardia present.     Heart sounds: No murmur heard. Pulmonary:     Effort: Pulmonary effort is normal. No respiratory distress.     Breath sounds: Normal breath sounds.  Abdominal:     Palpations: Abdomen is soft.     Tenderness: There is no abdominal tenderness.  Musculoskeletal:        General: No swelling.     Cervical back: Neck supple.  Lymphadenopathy:     Cervical: No cervical adenopathy.  Skin:    General: Skin is warm and dry.     Capillary Refill: Capillary  refill takes less than 2 seconds.     Findings: No rash.  Neurological:     General: No focal deficit present.     Mental Status: She is alert and oriented to person, place, and time.  Psychiatric:        Mood and Affect: Mood normal.     (all labs ordered are listed, but only abnormal results are displayed) Labs Reviewed  RESP PANEL BY RT-PCR (RSV, FLU A&B, COVID)  RVPGX2 - Abnormal; Notable for the following components:      Result Value   Influenza A by PCR POSITIVE (*)    All other components within normal limits    EKG: None  Radiology: No results found.   Procedures   Medications Ordered in the ED  acetaminophen  (TYLENOL ) tablet 650 mg (650 mg Oral Given 10/08/24 1155)                                     Medical Decision Making Risk OTC drugs.     Differential diagnosis includes but is not limited to COVID, flu, RSV, viral URI, strep pharyngitis, viral pharyngitis, allergic rhinitis, pneumonia, bronchitis   ED Course:  Upon initial evaluation, patient is well-appearing, no acute distress.  Slight tachycardia on initial examination at 112.  Afebrile.  Lungs clear to auscultation  Labs Ordered: I Ordered, and personally interpreted labs.  The pertinent results include:   Influenza positive. Covid and RSV negative   Medications Given: Tylenol   Upon re-evaluation, patient remains well appearing with stable vitals. Patient tested positive for Influenza which would explain patient's symptoms. Covid and RSV negative.  Doubt pneumonia given symptoms restarted yesterday, and lungs clear.  Patient without significant co-morbidities, no significant vital sign abnormalities, tolerating PO intake, do not feel patient needs any admission for treatment or observation.  Do not feel Tamiflu would be indicated given patient without significant comorbidities, no significant vital sign abnormalities, and low severity of symptoms. Patient stable and appropriate for discharge home at this time.     Impression: Influenza A  Disposition:  Discharged home with instructions to use over-the-counter medications as needed for symptom control.  Follow-up with PCP if symptoms not improving within the next 5 days. Remain out of school/work until symptoms improving and fever free for at least 24 hours without the use of fever reducing medication.  Work/school note provided. Return precautions given and patient verbalized understanding.     This chart was dictated using voice recognition software, Dragon. Despite the best efforts of this provider to proofread and correct errors, errors may still occur which can change documentation meaning.       Final diagnoses:  Influenza A    ED Discharge Orders      None          Veta Palma, PA-C 10/08/24 1232    Lenor Hollering, MD 10/08/24 1438  "

## 2024-11-02 ENCOUNTER — Telehealth: Payer: Self-pay | Admitting: Podiatry

## 2024-11-02 NOTE — Telephone Encounter (Signed)
 DOS- 11/03/2024  [place] BUNIONECTOMY RT- 71703  [employer] EFFECTIVE DATE- 10/20/2024  DEDUCTIBLE- $3500 REMAINING- $3382.96 OOP- $6350 REMAINING- $6232.96 FAMILY DEDUCTIBLE- $7000 REMAINING- $6882.96 FAMILY OOP- $12700 REMAINING- $12582.96 COINSURANCE- 20%  [employer] MEDICAID EFFECTIVE DATE- 04/19/2024  PER [employer] PORTAL, PRIOR AUTH FOR CPT CODE 71703 HAS BEEN APPROVED FROM 11/03/2024-12/17/2024. AUTH# [id number] [facility] COMM) PER [employer] PORTAL, PRIOR AUTH FOR CPT CODE 71703 HAS BEEN APPROVED FROM 11/03/2024-11/03/2024. AUTH# [id number] [facility] MCAID)

## 2024-11-03 ENCOUNTER — Other Ambulatory Visit: Payer: Self-pay | Admitting: Podiatry

## 2024-11-03 MED ORDER — OXYCODONE-ACETAMINOPHEN 5-325 MG PO TABS: 1.0000 | ORAL_TABLET | ORAL | 0 refills | Status: AC | PRN

## 2024-11-03 MED ORDER — IBUPROFEN 800 MG PO TABS: 800.0000 mg | ORAL_TABLET | Freq: Three times a day (TID) | ORAL | 1 refills | Status: AC

## 2024-11-03 NOTE — Progress Notes (Signed)
 PRN postop

## 2024-11-09 ENCOUNTER — Ambulatory Visit (INDEPENDENT_AMBULATORY_CARE_PROVIDER_SITE_OTHER): Admitting: Podiatry

## 2024-11-09 ENCOUNTER — Encounter: Payer: Self-pay | Admitting: Podiatry

## 2024-11-09 ENCOUNTER — Ambulatory Visit

## 2024-11-09 ENCOUNTER — Encounter

## 2024-11-09 VITALS — Ht 60.0 in | Wt 191.0 lb

## 2024-11-09 DIAGNOSIS — M2011 Hallux valgus (acquired), right foot: Secondary | ICD-10-CM

## 2024-11-15 NOTE — Progress Notes (Signed)
" ° °  Chief Complaint  Patient presents with   Routine Post Op    POV #1 DOS 11/03/2024 RT [place] BUNIONECTOMY,     Subjective:  Patient presents today status post bunionectomy with osteotomy right foot.  DOS: 11/03/2024.  Doing well.  Minimal WBAT cam boot as instructed  Past Medical History:  Diagnosis Date   Headache    Preterm labor     Past Surgical History:  Procedure Laterality Date   CESAREAN SECTION     CESAREAN SECTION N/A 11/23/2020   Procedure: CESAREAN SECTION;  Surgeon: Lilton Legions, DO;  Location: [facility] LD ORS;  Service: Obstetrics;  Laterality: N/A;    Allergies[1]  Objective/Physical Exam Neurovascular status intact.  Incision well coapted with sutures intact. No sign of infectious process noted. No dehiscence. No active bleeding noted.  Moderate edema noted to the surgical extremity.  Radiographic Exam RT foot 11/03/2024:  Orthopedic hardware and osteotomies sites appear to be stable with routine healing.  Overall good alignment of the first ray.  Assessment: 1. s/p bunionectomy with first metatarsal osteotomy RT foot. DOS: 11/03/2024   Plan of Care:  -Patient was evaluated. X-rays reviewed - Dressings changed.  Leave clean dry and intact x 1 week -Continue minimal WBAT cam boot -Return to clinic 1 week suture removal   Thresa EMERSON Sar, DPM [facility]  Dr. Thresa EMERSON Sar, DPM    [address] [place], [place] [address]                Office [phone]  Fax [phone]         [1]  Allergies Allergen Reactions   Aspirin Other (See Comments)     I feel funny--per pt  Other Reaction(s): Other (See Comments)  Reaction: Tremor (intolerance)  I have had it since and no reaction. To discuss with PCP.   "

## 2024-11-16 ENCOUNTER — Ambulatory Visit (INDEPENDENT_AMBULATORY_CARE_PROVIDER_SITE_OTHER): Admitting: Podiatry

## 2024-11-16 VITALS — Ht 60.0 in | Wt 191.0 lb

## 2024-11-16 DIAGNOSIS — M2011 Hallux valgus (acquired), right foot: Secondary | ICD-10-CM

## 2024-11-16 NOTE — Progress Notes (Signed)
" ° °  Chief Complaint  Patient presents with   Post-op Follow-up    RM 6 POV #2 DOS 11/03/2024  RT [place] BUNIONECTOMY(Kassius Battiste). Sutures removed, pt states minimum pain.    Subjective:  Patient presents today status post bunionectomy with osteotomy right foot.  DOS: 11/03/2024.  Doing well.  Minimal WBAT cam boot as instructed  Past Medical History:  Diagnosis Date   Headache    Preterm labor     Past Surgical History:  Procedure Laterality Date   CESAREAN SECTION     CESAREAN SECTION N/A 11/23/2020   Procedure: CESAREAN SECTION;  Surgeon: Lilton Legions, DO;  Location: [facility] ORS;  Service: Obstetrics;  Laterality: N/A;    Allergies[1]  Objective/Physical Exam Neurovascular status intact.  Incision well coapted with sutures intact. No sign of infectious process noted. No dehiscence. No active bleeding noted.  Improved edema noted to the surgical extremity.  Radiographic Exam RT foot 11/03/2024:  Orthopedic hardware and osteotomies sites appear to be stable with routine healing.  Overall good alignment of the first ray.  Assessment: 1. s/p bunionectomy with first metatarsal osteotomy RT foot. DOS: 11/03/2024   Plan of Care:  -Patient was evaluated.  Sutures removed -Continue minimal WBAT cam boot -Return to clinic in 3 weeks for follow-up x-ray and to transition patient out of the cam boot into good supportive tissues and sneakers   Thresa EMERSON Sar, DPM [facility]  Dr. Thresa EMERSON Sar, DPM    [address] [place], [place] [address]                Office [phone]  Fax [phone]         [1]  Allergies Allergen Reactions   Aspirin Other (See Comments)     I feel funny--per pt  Other Reaction(s): Other (See Comments)  Reaction: Tremor (intolerance)  I have had it since and no reaction. To discuss with PCP.   "

## 2024-11-23 ENCOUNTER — Encounter

## 2024-11-23 ENCOUNTER — Encounter: Admitting: Podiatry

## 2024-12-07 ENCOUNTER — Encounter: Admitting: Podiatry
